# Patient Record
Sex: Male | Born: 1965 | Race: White | Hispanic: Yes | Marital: Married | Smoking: Former smoker
Health system: Southern US, Community
[De-identification: ages and names within clinical notes are randomized; demographics above are authoritative.]

## PROBLEM LIST (undated history)

## (undated) DIAGNOSIS — F329 Major depressive disorder, single episode, unspecified: Secondary | ICD-10-CM

## (undated) DIAGNOSIS — F32A Depression, unspecified: Secondary | ICD-10-CM

## (undated) DIAGNOSIS — Z8719 Personal history of other diseases of the digestive system: Secondary | ICD-10-CM

## (undated) DIAGNOSIS — G4730 Sleep apnea, unspecified: Secondary | ICD-10-CM

## (undated) DIAGNOSIS — F419 Anxiety disorder, unspecified: Secondary | ICD-10-CM

## (undated) DIAGNOSIS — J45909 Unspecified asthma, uncomplicated: Secondary | ICD-10-CM

## (undated) DIAGNOSIS — K219 Gastro-esophageal reflux disease without esophagitis: Secondary | ICD-10-CM

## (undated) HISTORY — PX: SHOULDER SURGERY: SHX246

## (undated) HISTORY — PX: KNEE ARTHROSCOPY W/ ACL RECONSTRUCTION: SHX1858

---

## 2008-01-10 HISTORY — PX: SHOULDER SURGERY: SHX246

## 2015-05-26 ENCOUNTER — Other Ambulatory Visit: Payer: Self-pay | Admitting: Specialist

## 2015-06-09 ENCOUNTER — Ambulatory Visit
Admission: RE | Admit: 2015-06-09 | Discharge: 2015-06-09 | Disposition: A | Discharge status: Home / Self Care | Payer: Managed Care, Other (non HMO) | Source: Ambulatory Visit | Attending: Specialist | Admitting: Specialist

## 2015-06-09 DIAGNOSIS — R935 Abnormal findings on diagnostic imaging of other abdominal regions, including retroperitoneum: Secondary | ICD-10-CM | POA: Diagnosis not present

## 2015-06-09 DIAGNOSIS — R932 Abnormal findings on diagnostic imaging of liver and biliary tract: Secondary | ICD-10-CM | POA: Diagnosis not present

## 2015-08-03 DIAGNOSIS — D638 Anemia in other chronic diseases classified elsewhere: Secondary | ICD-10-CM | POA: Insufficient documentation

## 2015-08-03 DIAGNOSIS — E782 Mixed hyperlipidemia: Secondary | ICD-10-CM | POA: Insufficient documentation

## 2015-08-03 DIAGNOSIS — K219 Gastro-esophageal reflux disease without esophagitis: Secondary | ICD-10-CM | POA: Insufficient documentation

## 2015-08-03 DIAGNOSIS — F418 Other specified anxiety disorders: Secondary | ICD-10-CM | POA: Insufficient documentation

## 2015-08-03 DIAGNOSIS — I10 Essential (primary) hypertension: Secondary | ICD-10-CM | POA: Insufficient documentation

## 2015-10-04 ENCOUNTER — Other Ambulatory Visit: Payer: Managed Care, Other (non HMO)

## 2015-10-06 ENCOUNTER — Encounter
Admission: RE | Admit: 2015-10-06 | Discharge: 2015-10-06 | Disposition: A | Discharge status: Home / Self Care | Payer: Managed Care, Other (non HMO) | Source: Ambulatory Visit | Attending: Specialist | Admitting: Specialist

## 2015-10-06 DIAGNOSIS — G4733 Obstructive sleep apnea (adult) (pediatric): Secondary | ICD-10-CM | POA: Insufficient documentation

## 2015-10-06 DIAGNOSIS — Z6839 Body mass index (BMI) 39.0-39.9, adult: Secondary | ICD-10-CM | POA: Diagnosis not present

## 2015-10-06 DIAGNOSIS — Z01812 Encounter for preprocedural laboratory examination: Secondary | ICD-10-CM | POA: Diagnosis present

## 2015-10-06 HISTORY — DX: Unspecified asthma, uncomplicated: J45.909

## 2015-10-06 HISTORY — DX: Depression, unspecified: F32.A

## 2015-10-06 HISTORY — DX: Gastro-esophageal reflux disease without esophagitis: K21.9

## 2015-10-06 HISTORY — DX: Anxiety disorder, unspecified: F41.9

## 2015-10-06 HISTORY — DX: Personal history of other diseases of the digestive system: Z87.19

## 2015-10-06 HISTORY — DX: Major depressive disorder, single episode, unspecified: F32.9

## 2015-10-06 HISTORY — DX: Sleep apnea, unspecified: G47.30

## 2015-10-06 LAB — BASIC METABOLIC PANEL
ANION GAP: 8 (ref 5–15)
BUN: 23 mg/dL — ABNORMAL HIGH (ref 6–20)
CALCIUM: 8.9 mg/dL (ref 8.9–10.3)
CO2: 27 mmol/L (ref 22–32)
CREATININE: 0.86 mg/dL (ref 0.61–1.24)
Chloride: 104 mmol/L (ref 101–111)
Glucose, Bld: 87 mg/dL (ref 65–99)
Potassium: 3.8 mmol/L (ref 3.5–5.1)
Sodium: 139 mmol/L (ref 135–145)

## 2015-10-06 LAB — DIFFERENTIAL
BASOS ABS: 0 10*3/uL (ref 0–0.1)
BASOS PCT: 0 %
EOS ABS: 0.1 10*3/uL (ref 0–0.7)
EOS PCT: 1 %
LYMPHS ABS: 1.6 10*3/uL (ref 1.0–3.6)
Lymphocytes Relative: 26 %
Monocytes Absolute: 0.5 10*3/uL (ref 0.2–1.0)
Monocytes Relative: 8 %
NEUTROS PCT: 65 %
Neutro Abs: 3.9 10*3/uL (ref 1.4–6.5)

## 2015-10-06 LAB — CBC
HCT: 38.1 % — ABNORMAL LOW (ref 40.0–52.0)
HEMOGLOBIN: 13.6 g/dL (ref 13.0–18.0)
MCH: 29.2 pg (ref 26.0–34.0)
MCHC: 35.6 g/dL (ref 32.0–36.0)
MCV: 81.8 fL (ref 80.0–100.0)
PLATELETS: 239 10*3/uL (ref 150–440)
RBC: 4.65 MIL/uL (ref 4.40–5.90)
RDW: 13.7 % (ref 11.5–14.5)
WBC: 6.1 10*3/uL (ref 3.8–10.6)

## 2015-10-06 LAB — TYPE AND SCREEN
ABO/RH(D): O POS
Antibody Screen: NEGATIVE

## 2015-10-06 NOTE — Patient Instructions (Signed)
  Your procedure is scheduled on: October 12, 2015 (Tuesday) Report to [facility] 2nd floor [facility] To find out your arrival time please call [phone] between 1PM - 3PM on October 11, 2015 (Monday)  Remember: Instructions that are not followed completely may result in serious medical risk, up to and including death, or upon the discretion of your surgeon and anesthesiologist your surgery may need to be rescheduled.    _x___ 1. Do not eat food or drink liquids after midnight. No gum chewing or hard candies.     _x___ 2. No Alcohol for 24 hours before or after surgery.   _x_  __3. No Smoking for 24 prior to surgery.   ____  4. Bring all medications with you on the day of surgery if instructed.    __x__ 5. Notify your doctor if there is any change in your medical condition     (cold, fever, infections).     Do not wear jewelry, make-up, hairpins, clips or nail polish.  Do not wear lotions, powders, or perfumes. You may wear deodorant.  Do not shave 48 hours prior to surgery. Men may shave face and neck.  Do not bring valuables to the hospital.    [facility] is not responsible for any belongings or valuables.               Contacts, dentures or bridgework may not be worn into surgery.  Leave your suitcase in the car. After surgery it may be brought to your room.  For patients admitted to the hospital, discharge time is determined by your treatment team.   Patients discharged the day of surgery will not be allowed to drive home.    Please read over the following fact sheets that you were given:   [facility] Preparing for Surgery and or MRSA Information   ___ Take these medicines the morning of surgery with A SIP OF WATER:    1.   2.  3.  4.  5.  6.  ____Fleets enema or Magnesium Citrate as directed.   _x___ Use CHG Soap or sage wipes as directed on instruction sheet   ____ Use inhalers on the day of surgery and bring to hospital day of surgery  ____ Stop  metformin 2 days prior to surgery    ____ Take 1/2 of usual insulin dose the night before surgery and none on the morning of surgery.          _x___ Stop aspirin or coumadin, or plavix (NO ASPIRIN)  x__ Stop Anti-inflammatories such as Advil, Aleve, Ibuprofen, Motrin, Naproxen,          Naprosyn, Goodies powders or aspirin products. Ok to take Tylenol.   ____ Stop supplements until after surgery.    _x___ Bring C-Pap to the hospital.

## 2015-10-12 ENCOUNTER — Inpatient Hospital Stay
Admission: RE | Admit: 2015-10-12 | Discharge: 2015-10-13 | DRG: 620 | Disposition: A | Discharge status: Home / Self Care | Payer: Managed Care, Other (non HMO) | Source: Ambulatory Visit | Attending: Specialist | Admitting: Specialist

## 2015-10-12 ENCOUNTER — Encounter: Payer: Self-pay | Admitting: *Deleted

## 2015-10-12 ENCOUNTER — Encounter
Admission: RE | Disposition: A | Discharge status: Home / Self Care | Payer: Self-pay | Source: Ambulatory Visit | Attending: Specialist

## 2015-10-12 ENCOUNTER — Inpatient Hospital Stay: Payer: Managed Care, Other (non HMO) | Admitting: Anesthesiology

## 2015-10-12 DIAGNOSIS — Z6841 Body Mass Index (BMI) 40.0 and over, adult: Secondary | ICD-10-CM | POA: Diagnosis not present

## 2015-10-12 DIAGNOSIS — F419 Anxiety disorder, unspecified: Secondary | ICD-10-CM | POA: Diagnosis present

## 2015-10-12 DIAGNOSIS — J45909 Unspecified asthma, uncomplicated: Secondary | ICD-10-CM | POA: Diagnosis present

## 2015-10-12 DIAGNOSIS — F329 Major depressive disorder, single episode, unspecified: Secondary | ICD-10-CM | POA: Diagnosis present

## 2015-10-12 DIAGNOSIS — R12 Heartburn: Secondary | ICD-10-CM | POA: Diagnosis present

## 2015-10-12 DIAGNOSIS — Z8261 Family history of arthritis: Secondary | ICD-10-CM

## 2015-10-12 DIAGNOSIS — Z87891 Personal history of nicotine dependence: Secondary | ICD-10-CM

## 2015-10-12 DIAGNOSIS — Z79899 Other long term (current) drug therapy: Secondary | ICD-10-CM | POA: Diagnosis not present

## 2015-10-12 DIAGNOSIS — K219 Gastro-esophageal reflux disease without esophagitis: Secondary | ICD-10-CM | POA: Diagnosis present

## 2015-10-12 DIAGNOSIS — K760 Fatty (change of) liver, not elsewhere classified: Secondary | ICD-10-CM | POA: Diagnosis present

## 2015-10-12 DIAGNOSIS — K449 Diaphragmatic hernia without obstruction or gangrene: Secondary | ICD-10-CM | POA: Diagnosis present

## 2015-10-12 DIAGNOSIS — Z8249 Family history of ischemic heart disease and other diseases of the circulatory system: Secondary | ICD-10-CM

## 2015-10-12 DIAGNOSIS — Z9989 Dependence on other enabling machines and devices: Secondary | ICD-10-CM

## 2015-10-12 DIAGNOSIS — G4733 Obstructive sleep apnea (adult) (pediatric): Secondary | ICD-10-CM | POA: Diagnosis present

## 2015-10-12 DIAGNOSIS — K8021 Calculus of gallbladder without cholecystitis with obstruction: Secondary | ICD-10-CM | POA: Diagnosis present

## 2015-10-12 HISTORY — PX: LAPAROSCOPIC GASTRIC SLEEVE RESECTION: SHX5895

## 2015-10-12 HISTORY — PX: CHOLECYSTECTOMY: SHX55

## 2015-10-12 HISTORY — PX: HIATAL HERNIA REPAIR: SHX195

## 2015-10-12 LAB — CBC
HEMATOCRIT: 39.7 % — AB (ref 40.0–52.0)
Hemoglobin: 13.6 g/dL (ref 13.0–18.0)
MCH: 28.4 pg (ref 26.0–34.0)
MCHC: 34.3 g/dL (ref 32.0–36.0)
MCV: 82.8 fL (ref 80.0–100.0)
PLATELETS: 243 10*3/uL (ref 150–440)
RBC: 4.79 MIL/uL (ref 4.40–5.90)
RDW: 13.8 % (ref 11.5–14.5)
WBC: 11.8 10*3/uL — ABNORMAL HIGH (ref 3.8–10.6)

## 2015-10-12 LAB — HEMOGLOBIN AND HEMATOCRIT, BLOOD
HCT: 39 % — ABNORMAL LOW (ref 40.0–52.0)
Hemoglobin: 13.5 g/dL (ref 13.0–18.0)

## 2015-10-12 LAB — CREATININE, SERUM
Creatinine, Ser: 1.15 mg/dL (ref 0.61–1.24)
GFR calc Af Amer: >60 mL/min (ref >60)
GFR calc non Af Amer: >60 mL/min (ref >60)

## 2015-10-12 LAB — ABO/RH: ABO/RH(D): O POS

## 2015-10-12 SURGERY — GASTRECTOMY, SLEEVE, LAPAROSCOPIC
Anesthesia: General | Wound class: Clean Contaminated

## 2015-10-12 MED ORDER — PHENYLEPHRINE HCL 10 MG/ML IJ SOLN
INTRAMUSCULAR | Status: DC | PRN
  Administered 2015-10-12 (×4): 100 ug via INTRAVENOUS

## 2015-10-12 MED ORDER — SUCCINYLCHOLINE CHLORIDE 20 MG/ML IJ SOLN
INTRAMUSCULAR | Status: DC | PRN
  Administered 2015-10-12: 120 mg via INTRAVENOUS

## 2015-10-12 MED ORDER — PROMETHAZINE HCL 25 MG/ML IJ SOLN: 12.5000 mg | Freq: Four times a day (QID) | INTRAMUSCULAR | Status: DC | PRN

## 2015-10-12 MED ORDER — ROCURONIUM BROMIDE 100 MG/10ML IV SOLN
INTRAVENOUS | Status: DC | PRN
  Administered 2015-10-12 (×2): 50 mg via INTRAVENOUS

## 2015-10-12 MED ORDER — CEFOXITIN SODIUM 2 G IV SOLR
2.0000 g | Freq: Three times a day (TID) | INTRAVENOUS | Status: AC
  Administered 2015-10-12 – 2015-10-13 (×2): 2 g via INTRAVENOUS
  Filled 2015-10-12 (×3): qty 2

## 2015-10-12 MED ORDER — LIDOCAINE-EPINEPHRINE (PF) 1 %-1:200000 IJ SOLN
INTRAMUSCULAR | Status: AC
  Filled 2015-10-12: qty 30

## 2015-10-12 MED ORDER — HYDROMORPHONE HCL 1 MG/ML IJ SOLN
INTRAMUSCULAR | Status: AC
  Filled 2015-10-12: qty 1

## 2015-10-12 MED ORDER — CEFOXITIN SODIUM-DEXTROSE 2-2.2 GM-% IV SOLR (PREMIX)
2.0000 g | Freq: Once | INTRAVENOUS | Status: AC
  Administered 2015-10-12: 2000 mg via INTRAVENOUS

## 2015-10-12 MED ORDER — ACETAMINOPHEN 160 MG/5ML PO SOLN
650.0000 mg | ORAL | Status: DC | PRN
  Filled 2015-10-12: qty 20.3

## 2015-10-12 MED ORDER — PREMIER PROTEIN SHAKE: 2.0000 [oz_av] | ORAL | Status: DC

## 2015-10-12 MED ORDER — FENTANYL CITRATE (PF) 100 MCG/2ML IJ SOLN
25.0000 ug | INTRAMUSCULAR | Status: AC | PRN
  Administered 2015-10-12 (×6): 25 ug via INTRAVENOUS

## 2015-10-12 MED ORDER — EPHEDRINE SULFATE 50 MG/ML IJ SOLN
INTRAMUSCULAR | Status: DC | PRN
  Administered 2015-10-12: 10 mg via INTRAVENOUS

## 2015-10-12 MED ORDER — SCOPOLAMINE 1 MG/3DAYS TD PT72
MEDICATED_PATCH | TRANSDERMAL | Status: AC
  Filled 2015-10-12: qty 1

## 2015-10-12 MED ORDER — LACTATED RINGERS IV SOLN
INTRAVENOUS | Status: DC
  Administered 2015-10-12: 12:00:00 via INTRAVENOUS

## 2015-10-12 MED ORDER — PROMETHAZINE HCL 25 MG/ML IJ SOLN: 6.2500 mg | INTRAMUSCULAR | Status: DC | PRN

## 2015-10-12 MED ORDER — MORPHINE SULFATE (PF) 2 MG/ML IV SOLN
2.0000 mg | INTRAVENOUS | Status: DC | PRN
  Administered 2015-10-12 – 2015-10-13 (×3): 2 mg via INTRAVENOUS
  Filled 2015-10-12 (×3): qty 1

## 2015-10-12 MED ORDER — CEFOXITIN SODIUM-DEXTROSE 2-2.2 GM-% IV SOLR (PREMIX)
INTRAVENOUS | Status: AC
  Filled 2015-10-12: qty 50

## 2015-10-12 MED ORDER — DEXAMETHASONE SODIUM PHOSPHATE 10 MG/ML IJ SOLN
INTRAMUSCULAR | Status: DC | PRN
  Administered 2015-10-12: 10 mg via INTRAVENOUS

## 2015-10-12 MED ORDER — ACETAMINOPHEN 10 MG/ML IV SOLN
1000.0000 mg | Freq: Once | INTRAVENOUS | Status: AC
  Administered 2015-10-12: 1000 mg via INTRAVENOUS

## 2015-10-12 MED ORDER — BUPIVACAINE-EPINEPHRINE (PF) 0.5% -1:200000 IJ SOLN
INTRAMUSCULAR | Status: AC
  Filled 2015-10-12: qty 30

## 2015-10-12 MED ORDER — PROPOFOL 10 MG/ML IV BOLUS
INTRAVENOUS | Status: DC | PRN
  Administered 2015-10-12: 200 mg via INTRAVENOUS
  Administered 2015-10-12: 50 mg via INTRAVENOUS

## 2015-10-12 MED ORDER — POTASSIUM CHLORIDE IN NACL 20-0.9 MEQ/L-% IV SOLN
INTRAVENOUS | Status: DC
  Administered 2015-10-12 – 2015-10-13 (×2): via INTRAVENOUS
  Filled 2015-10-12 (×6): qty 1000

## 2015-10-12 MED ORDER — ONDANSETRON HCL 4 MG/2ML IJ SOLN
INTRAMUSCULAR | Status: DC | PRN
  Administered 2015-10-12: 4 mg via INTRAVENOUS

## 2015-10-12 MED ORDER — ENOXAPARIN SODIUM 30 MG/0.3ML ~~LOC~~ SOLN
30.0000 mg | Freq: Two times a day (BID) | SUBCUTANEOUS | Status: DC
  Administered 2015-10-13: 30 mg via SUBCUTANEOUS
  Filled 2015-10-12: qty 0.3

## 2015-10-12 MED ORDER — ACETAMINOPHEN 160 MG/5ML PO SOLN
325.0000 mg | ORAL | Status: DC | PRN
  Filled 2015-10-12: qty 20.3

## 2015-10-12 MED ORDER — ONDANSETRON HCL 4 MG/2ML IJ SOLN
4.0000 mg | Freq: Four times a day (QID) | INTRAMUSCULAR | Status: DC
  Administered 2015-10-12 (×2): 4 mg via INTRAVENOUS
  Filled 2015-10-12 (×2): qty 2

## 2015-10-12 MED ORDER — ONDANSETRON HCL 4 MG/2ML IJ SOLN
4.0000 mg | INTRAMUSCULAR | Status: DC | PRN
  Filled 2015-10-12: qty 2

## 2015-10-12 MED ORDER — ACETAMINOPHEN 10 MG/ML IV SOLN
INTRAVENOUS | Status: AC
  Filled 2015-10-12: qty 100

## 2015-10-12 MED ORDER — FENTANYL CITRATE (PF) 100 MCG/2ML IJ SOLN
INTRAMUSCULAR | Status: AC
  Administered 2015-10-12: 25 ug
  Filled 2015-10-12: qty 2

## 2015-10-12 MED ORDER — FENTANYL CITRATE (PF) 100 MCG/2ML IJ SOLN
INTRAMUSCULAR | Status: DC | PRN
  Administered 2015-10-12 (×2): 50 ug via INTRAVENOUS
  Administered 2015-10-12: 100 ug via INTRAVENOUS

## 2015-10-12 MED ORDER — MIDAZOLAM HCL 2 MG/2ML IJ SOLN
INTRAMUSCULAR | Status: DC | PRN
  Administered 2015-10-12: 2 mg via INTRAVENOUS

## 2015-10-12 MED ORDER — OXYCODONE HCL 5 MG/5ML PO SOLN
5.0000 mg | ORAL | Status: DC | PRN
  Administered 2015-10-13: 10 mg via ORAL
  Administered 2015-10-13: 5 mg via ORAL
  Filled 2015-10-12: qty 10
  Filled 2015-10-12: qty 5

## 2015-10-12 MED ORDER — BUPIVACAINE-EPINEPHRINE (PF) 0.5% -1:200000 IJ SOLN
INTRAMUSCULAR | Status: DC | PRN
  Administered 2015-10-12: 17 mL

## 2015-10-12 MED ORDER — LIDOCAINE HCL (CARDIAC) 20 MG/ML IV SOLN
INTRAVENOUS | Status: DC | PRN
  Administered 2015-10-12: 100 mg via INTRAVENOUS

## 2015-10-12 MED ORDER — LIDOCAINE-EPINEPHRINE (PF) 1 %-1:200000 IJ SOLN
INTRAMUSCULAR | Status: DC | PRN
  Administered 2015-10-12: 8 mL

## 2015-10-12 MED ORDER — BUPIVACAINE-EPINEPHRINE (PF) 0.5% -1:200000 IJ SOLN
INTRAMUSCULAR | Status: AC
  Filled 2015-10-12: qty 10

## 2015-10-12 MED ORDER — FENTANYL CITRATE (PF) 100 MCG/2ML IJ SOLN
INTRAMUSCULAR | Status: AC
  Filled 2015-10-12: qty 2

## 2015-10-12 MED ORDER — GLYCOPYRROLATE 0.2 MG/ML IJ SOLN
INTRAMUSCULAR | Status: DC | PRN
  Administered 2015-10-12: 0.2 mg via INTRAVENOUS

## 2015-10-12 MED ORDER — SUGAMMADEX SODIUM 500 MG/5ML IV SOLN
INTRAVENOUS | Status: DC | PRN
  Administered 2015-10-12: 250 mg via INTRAVENOUS

## 2015-10-12 MED ORDER — HYDROMORPHONE HCL 1 MG/ML IJ SOLN
0.5000 mg | INTRAMUSCULAR | Status: AC | PRN
  Administered 2015-10-12 (×4): 0.5 mg via INTRAVENOUS

## 2015-10-12 MED ORDER — LACTATED RINGERS IV SOLN
INTRAVENOUS | Status: DC | PRN
  Administered 2015-10-12: 13:00:00 via INTRAVENOUS

## 2015-10-12 MED ORDER — SCOPOLAMINE 1 MG/3DAYS TD PT72
1.0000 | MEDICATED_PATCH | TRANSDERMAL | Status: DC
  Administered 2015-10-12: 1.5 mg via TRANSDERMAL

## 2015-10-12 SURGICAL SUPPLY — 74 items
ANCHOR TIS RET SYS 235ML (MISCELLANEOUS) ×3 IMPLANT
APPLIER CLIP ROT 10 11.4 M/L (STAPLE)
APPLIER CLIP ROT 13.4 12 LRG (CLIP) ×3
BANDAGE ELASTIC 6 LF NS (GAUZE/BANDAGES/DRESSINGS) ×6 IMPLANT
BLADE SURG SZ11 CARB STEEL (BLADE) ×3 IMPLANT
CANISTER SUCT 1200ML W/VALVE (MISCELLANEOUS) ×3 IMPLANT
CANNULA DILATOR 10 W/SLV (CANNULA) ×2 IMPLANT
CANNULA DILATOR 10MM W/SLV (CANNULA) ×1
CATH REDDICK CHOLANGI 4FR 50CM (CATHETERS) IMPLANT
CHLORAPREP W/TINT 26ML (MISCELLANEOUS) ×9 IMPLANT
CLIP APPLIE ROT 10 11.4 M/L (STAPLE) IMPLANT
CLIP APPLIE ROT 13.4 12 LRG (CLIP) ×1 IMPLANT
CLOSURE WOUND 1/2 X4 (GAUZE/BANDAGES/DRESSINGS)
DECANTER SPIKE VIAL GLASS SM (MISCELLANEOUS) ×3 IMPLANT
DEFOGGER SCOPE WARMER CLEARIFY (MISCELLANEOUS) ×3 IMPLANT
DRAPE SHEET LG 3/4 BI-LAMINATE (DRAPES) IMPLANT
DRAPE UTILITY 15X26 TOWEL STRL (DRAPES) ×6 IMPLANT
DRSG TEGADERM 4X4.75 (GAUZE/BANDAGES/DRESSINGS) IMPLANT
ELECT REM PT RETURN 9FT ADLT (ELECTROSURGICAL) ×3
ELECTRODE REM PT RTRN 9FT ADLT (ELECTROSURGICAL) ×1 IMPLANT
FILTER LAP SMOKE EVAC STRL (MISCELLANEOUS) ×3 IMPLANT
GAUZE SPONGE 4X4 12PLY STRL (GAUZE/BANDAGES/DRESSINGS) IMPLANT
GLOVE BIO SURGEON STRL SZ 6.5 (GLOVE) ×8 IMPLANT
GLOVE BIO SURGEON STRL SZ8 (GLOVE) ×3 IMPLANT
GLOVE BIO SURGEONS STRL SZ 6.5 (GLOVE) ×4
GOWN STRL REUS W/ TWL LRG LVL3 (GOWN DISPOSABLE) ×2 IMPLANT
GOWN STRL REUS W/ TWL XL LVL3 (GOWN DISPOSABLE) ×1 IMPLANT
GOWN STRL REUS W/TWL LRG LVL3 (GOWN DISPOSABLE) ×4
GOWN STRL REUS W/TWL XL LVL3 (GOWN DISPOSABLE) ×2
IRRIGATION STRYKERFLOW (MISCELLANEOUS) ×1 IMPLANT
IRRIGATOR STRYKERFLOW (MISCELLANEOUS) ×3
IV NS 1000ML (IV SOLUTION) ×2
IV NS 1000ML BAXH (IV SOLUTION) ×1 IMPLANT
KIT RM TURNOVER STRD PROC AR (KITS) ×3 IMPLANT
LABEL OR SOLS (LABEL) ×3 IMPLANT
LIQUID BAND (GAUZE/BANDAGES/DRESSINGS) ×3 IMPLANT
NDL INSUFF 14G 150MM VS150000 (NEEDLE) ×3 IMPLANT
NDL INSUFF ACCESS 14 VERSASTEP (NEEDLE) ×3 IMPLANT
NDL SAFETY 22GX1.5 (NEEDLE) ×3 IMPLANT
NEEDLE FILTER BLUNT 18X 1/2SAF (NEEDLE)
NEEDLE FILTER BLUNT 18X1 1/2 (NEEDLE) IMPLANT
NS IRRIG 500ML POUR BTL (IV SOLUTION) ×3 IMPLANT
PACK LAP CHOLECYSTECTOMY (MISCELLANEOUS) ×3 IMPLANT
RELOAD GREEN (STAPLE) ×3 IMPLANT
RELOAD STAPLER GOLD 60MM (STAPLE) ×5 IMPLANT
SCISSORS METZENBAUM CVD 33 (INSTRUMENTS) IMPLANT
SEAL FOR SCOPE WARMER C3101 (MISCELLANEOUS) ×3 IMPLANT
SHEARS HARMONIC ACE PLUS 45CM (MISCELLANEOUS) ×3 IMPLANT
SLEEVE ENDOPATH XCEL 5M (ENDOMECHANICALS) ×6 IMPLANT
SLEEVE GASTRECTOMY 36FR VISIGI (MISCELLANEOUS) ×3 IMPLANT
STAPLER ECHELON BIOABSB 60 FLE (MISCELLANEOUS) ×15 IMPLANT
STAPLER ECHELON LONG 60 440 (INSTRUMENTS) ×3 IMPLANT
STAPLER RELOAD GOLD 60MM (STAPLE) ×15
STRIP CLOSURE SKIN 1/2X4 (GAUZE/BANDAGES/DRESSINGS) IMPLANT
SUT CHROMIC 5 0 RB 1 27 (SUTURE) IMPLANT
SUT DEVICE BRAIDED 0X39 (SUTURE) ×6 IMPLANT
SUT MNCRL 4-0 (SUTURE) ×4
SUT MNCRL 4-0 27XMFL (SUTURE) ×2
SUT VIC AB 0 CT2 27 (SUTURE) ×3 IMPLANT
SUT VIC AB 3-0 SH 27 (SUTURE)
SUT VIC AB 3-0 SH 27X BRD (SUTURE) IMPLANT
SUT VIC AB 4-0 PS2 18 (SUTURE) ×6 IMPLANT
SUTURE MNCRL 4-0 27XMF (SUTURE) ×2 IMPLANT
SYR 20CC LL (SYRINGE) ×3 IMPLANT
SYR 3ML LL SCALE MARK (SYRINGE) ×3 IMPLANT
TROCAR BLADELESS 15MM (ENDOMECHANICALS) ×3 IMPLANT
TROCAR SL VERSASTEP 5M LG  B (MISCELLANEOUS) ×2
TROCAR SL VERSASTEP 5M LG B (MISCELLANEOUS) ×1 IMPLANT
TROCAR XCEL 12X100 BLDLESS (ENDOMECHANICALS) ×3 IMPLANT
TROCAR XCEL NON-BLD 11X100MML (ENDOMECHANICALS) ×3 IMPLANT
TROCAR XCEL NON-BLD 5MMX100MML (ENDOMECHANICALS) ×3 IMPLANT
TUBING INSUFFLATOR HEATED (MISCELLANEOUS) ×3 IMPLANT
TUBING INSUFFLATOR HI FLOW (MISCELLANEOUS) ×3 IMPLANT
WATER STERILE IRR 1000ML POUR (IV SOLUTION) ×3 IMPLANT

## 2015-10-12 NOTE — H&P (Signed)
See scanned h and p

## 2015-10-12 NOTE — Anesthesia Procedure Notes (Signed)
Procedure Name: Intubation Date/Time: 10/12/2015 1:19 PM Performed by: Michaele OfferSAVAGE, Katarzyna Wolven Pre-anesthesia Checklist: Patient identified, Emergency Drugs available, Suction available, Patient being monitored and Timeout performed Patient Re-evaluated:Patient Re-evaluated prior to inductionOxygen Delivery Method: Circle system utilized Preoxygenation: Pre-oxygenation with 100% oxygen Intubation Type: IV induction Ventilation: Oral airway inserted - appropriate to patient size Laryngoscope Size: Mac and 4 Grade View: Grade II Tube type: Oral Tube size: 7.5 mm Number of attempts: 1 Airway Equipment and Method: Rigid stylet Placement Confirmation: ETT inserted through vocal cords under direct vision,  positive ETCO2 and breath sounds checked- equal and bilateral Secured at: 21 cm Tube secured with: Tape Dental Injury: Teeth and Oropharynx as per pre-operative assessment

## 2015-10-12 NOTE — Brief Op Note (Signed)
10/12/2015  2:59 PM  PATIENT:  Victorio PalmAlfred House  50 y.o. male  PRE-OPERATIVE DIAGNOSIS:  morbid obesity  POST-OPERATIVE DIAGNOSIS:  morbid obesity  PROCEDURE:  Procedure(s): LAPAROSCOPIC GASTRIC SLEEVE RESECTION (N/A) LAPAROSCOPIC REPAIR OF HIATAL HERNIA (N/A) LAPAROSCOPIC CHOLECYSTECTOMY (N/A)  SURGEON:  Surgeon(s) and Role:    * Geoffry ParadiseJon M Kelyn Ponciano, MD - Primary  PHYSICIAN ASSISTANT:   ASSISTANTS: none   ANESTHESIA:   general  EBL:  Total I/O In: 900 [I.V.:900] Out: 25 [Blood:25]  BLOOD ADMINISTERED:none  DRAINS: none   LOCAL MEDICATIONS USED:  MARCAINE    and LIDOCAINE   SPECIMEN:  Source of Specimen:  portion of stomach; gall bladder  DISPOSITION OF SPECIMEN:  PATHOLOGY  COUNTS:  YES  TOURNIQUET:  * No tourniquets in log *  DICTATION: .Note written in EPIC  PLAN OF CARE: Admit to inpatient   PATIENT DISPOSITION:  PACU - hemodynamically stable.   Delay start of Pharmacological VTE agent (>24hrs) due to surgical blood loss or risk of bleeding: not applicable

## 2015-10-12 NOTE — Anesthesia Preprocedure Evaluation (Signed)
Anesthesia Evaluation  Patient identified by MRN, date of birth, ID band Patient awake    Reviewed: Allergy & Precautions, H&P , NPO status , Patient's Chart, lab work & pertinent test results  History of Anesthesia Complications Negative for: history of anesthetic complications  Airway Mallampati: III  TM Distance: <3 FB Neck ROM: full    Dental no notable dental hx. (+) Poor Dentition, Chipped   Pulmonary neg shortness of breath, asthma , sleep apnea and Continuous Positive Airway Pressure Ventilation , former smoker,    Pulmonary exam normal breath sounds clear to auscultation       Cardiovascular Exercise Tolerance: Good (-) angina(-) Past MI and (-) DOE negative cardio ROS Normal cardiovascular exam Rhythm:regular Rate:Normal     Neuro/Psych PSYCHIATRIC DISORDERS Anxiety Depression negative neurological ROS     GI/Hepatic Neg liver ROS, hiatal hernia, GERD  Medicated and Controlled,  Endo/Other  negative endocrine ROS  Renal/GU      Musculoskeletal   Abdominal   Peds  Hematology negative hematology ROS (+)   Anesthesia Other Findings Past Medical History: No date: Anxiety No date: Asthma     Comment: childhood asthma No date: Depression No date: GERD (gastroesophageal reflux disease) No date: History of hiatal hernia No date: Sleep apnea     Comment: C-PAP  Past Surgical History: No date: KNEE ARTHROSCOPY W/ ACL RECONSTRUCTION Right No date: SHOULDER SURGERY Right 2010: SHOULDER SURGERY Left     [facility] [place]  BMI    Body Mass Index:  39.13 kg/m      Reproductive/Obstetrics negative OB ROS                             Anesthesia Physical Anesthesia Plan  ASA: III  Anesthesia Plan: General ETT   Post-op Pain Management:    Induction:   Airway Management Planned:   Additional Equipment:   Intra-op Plan:   Post-operative Plan:    Informed Consent: I have reviewed the patients History and Physical, chart, labs and discussed the procedure including the risks, benefits and alternatives for the proposed anesthesia with the patient or authorized representative who has indicated his/her understanding and acceptance.     Plan Discussed with: Anesthesiologist, CRNA and Surgeon  Anesthesia Plan Comments:         Anesthesia Quick Evaluation

## 2015-10-12 NOTE — Plan of Care (Signed)
Problem: Pain Management: Goal: General experience of comfort will improve Outcome: Progressing PRN medication provided

## 2015-10-12 NOTE — Transfer of Care (Signed)
Immediate Anesthesia Transfer of Care Note  Patient: Kent Brooks  Procedure(s) Performed: Procedure(s): LAPAROSCOPIC GASTRIC SLEEVE RESECTION (N/A) LAPAROSCOPIC REPAIR OF HIATAL HERNIA (N/A) LAPAROSCOPIC CHOLECYSTECTOMY (N/A)  Patient Location: PACU  Anesthesia Type:General  Level of Consciousness: awake, alert , oriented and patient cooperative  Airway & Oxygen Therapy: Patient Spontanous Breathing and Patient connected to face mask oxygen  Post-op Assessment: Report given to RN, Post -op Vital signs reviewed and stable and Patient moving all extremities X 4  Post vital signs: Reviewed and stable  Last Vitals:  Vitals:   10/12/15 1131  BP: 133/68  Pulse: 63  Resp: 16  Temp: 36.8 C    Last Pain:  Vitals:   10/12/15 1131  TempSrc: Oral         Complications: No apparent anesthesia complications

## 2015-10-13 ENCOUNTER — Encounter: Payer: Self-pay | Admitting: Specialist

## 2015-10-13 LAB — CBC WITH DIFFERENTIAL/PLATELET
Basophils Absolute: 0 10*3/uL (ref 0–0.1)
Basophils Relative: 0 %
Eosinophils Absolute: 0 10*3/uL (ref 0–0.7)
Eosinophils Relative: 0 %
HEMATOCRIT: 37.4 % — AB (ref 40.0–52.0)
Hemoglobin: 12.9 g/dL — ABNORMAL LOW (ref 13.0–18.0)
LYMPHS PCT: 12 %
Lymphs Abs: 0.8 10*3/uL — ABNORMAL LOW (ref 1.0–3.6)
MCH: 28.7 pg (ref 26.0–34.0)
MCHC: 34.6 g/dL (ref 32.0–36.0)
MCV: 83 fL (ref 80.0–100.0)
MONO ABS: 0.5 10*3/uL (ref 0.2–1.0)
MONOS PCT: 8 %
NEUTROS ABS: 5.3 10*3/uL (ref 1.4–6.5)
Neutrophils Relative %: 80 %
Platelets: 240 10*3/uL (ref 150–440)
RBC: 4.5 MIL/uL (ref 4.40–5.90)
RDW: 14 % (ref 11.5–14.5)
WBC: 6.6 10*3/uL (ref 3.8–10.6)

## 2015-10-13 NOTE — Progress Notes (Signed)
Patient discharge teaching given, including activity, diet, follow-up appoints, and medications. Patient verbalized understanding of all discharge instructions. IV access was d/c'd. Vitals are stable. Skin is intact except as charted in most recent assessments. Pt to be escorted out by NT, to be driven home by family.  Kent Brooks E Hobbs  

## 2015-10-13 NOTE — Discharge Summary (Signed)
Physician Discharge Summary Note  Patient:  Kent Brooks is an 50 y.o., male MRN:  [id number] DOB:  Aug 30, 1965 Patient phone:  [phone] (home)  Patient address:   [address] [place] [address],  Total Time spent with patient: 15 minutes  Date of Admission:  10/12/2015 Date of Discharge: 10/13/15  Reason for Admission:  Morbid Obesity  Principal Problem: <principal problem not specified> Discharge Diagnoses: Patient Active Problem List   Diagnosis Date Noted  . Morbid obesity ([place]) [E66.01] 10/12/2015    Past Psychiatric History: n/a  Past Medical History:  Past Medical History:  Diagnosis Date  . Anxiety   . Asthma    childhood asthma  . Depression   . GERD (gastroesophageal reflux disease)   . History of hiatal hernia   . Sleep apnea    C-PAP    Past Surgical History:  Procedure Laterality Date  . CHOLECYSTECTOMY N/A 10/12/2015   Procedure: LAPAROSCOPIC CHOLECYSTECTOMY;  Surgeon: Bonner Puna, MD;  Location: [facility] ORS;  Service: General;  Laterality: N/A;  . HIATAL HERNIA REPAIR N/A 10/12/2015   Procedure: LAPAROSCOPIC REPAIR OF HIATAL HERNIA;  Surgeon: Bonner Puna, MD;  Location: [facility] ORS;  Service: General;  Laterality: N/A;  . KNEE ARTHROSCOPY W/ ACL RECONSTRUCTION Right   . LAPAROSCOPIC GASTRIC SLEEVE RESECTION N/A 10/12/2015   Procedure: LAPAROSCOPIC GASTRIC SLEEVE RESECTION;  Surgeon: Bonner Puna, MD;  Location: [facility] ORS;  Service: General;  Laterality: N/A;  . SHOULDER SURGERY Right   . SHOULDER SURGERY Left 2010   [facility], [place]   Family History: History reviewed. No pertinent family history. Family Psychiatric  History: n/a Social History:  History  Alcohol use Not on file    Comment: occassional     History  Drug Use No    Social History   Social History  . Marital status: Married    Spouse name: N/A  . Number of children: N/A  . Years of education: N/A   Social History Main Topics  . Smoking status: Former Smoker   Packs/day: 0.50    Types: Cigarettes    Quit date: 03/10/2002  . Smokeless tobacco: Never Used  . Alcohol use None     Comment: occassional  . Drug use: No  . Sexual activity: Not Asked   Other Topics Concern  . None   Social History Narrative  . None    Hospital Course:  Pt underwent laparoscopic sleeve gastrectomy and cholecystectomy and had an unremarkable post operative course. He met discharge criteria pod1 and was dc'd home.  Physical Findings: AIMS:  , ,  ,  ,    CIWA:    COWS:     Musculoskeletal: Strength & Muscle Tone: within normal limits Gait & Station: normal Patient leans: n/a  Psychiatric Specialty Exam: Physical Exam  ROS  Blood pressure 128/66, pulse 68, temperature 98.6 F (37 C), temperature source Oral, resp. rate 18, height 5' 7.5" (1.715 m), weight 123.2 kg (271 lb 9.7 oz), SpO2 95 %.Body mass index is 41.91 kg/m.  General Appearance: Well Groomed  Eye Contact:  Good  Speech:  Normal Rate  Volume:  Normal  Mood:  nl  Affect:  NA  Thought Process:  Coherent  Orientation:  Negative  Thought Content:  NA  Suicidal Thoughts:  No  Homicidal Thoughts:  No  Memory:  NA  Judgement:  NA  Insight:  NA  Psychomotor Activity:  NA  Concentration:  Concentration: NA  Recall:  NA  Fund of Knowledge:  NA  Language:  NA  Akathisia:  NA  Handed:  Right  AIMS (if indicated):     Assets:  Others:  n/a  ADL's:  Intact  Cognition:  WNL  Sleep:          Has this patient used any form of tobacco in the last 30 days? (Cigarettes, Smokeless Tobacco, Cigars, and/or Pipes) Yes, No  Blood Alcohol level:  No results found for: [facility]  Metabolic Disorder Labs:  No results found for: HGBA1C, MPG No results found for: PROLACTIN No results found for: CHOL, TRIG, HDL, CHOLHDL, VLDL, LDLCALC    Discharge destination:  Home   Discharge Instructions    Call MD for:  difficulty breathing, headache or visual disturbances    Complete by:  As directed    Call  MD for:  extreme fatigue    Complete by:  As directed    Call MD for:  persistant dizziness or light-headedness    Complete by:  As directed    Call MD for:  persistant nausea and vomiting    Complete by:  As directed    Call MD for:  redness, tenderness, or signs of infection (pain, swelling, redness, odor or green/yellow discharge around incision site)    Complete by:  As directed    Call MD for:  severe uncontrolled pain    Complete by:  As directed    Call MD for:  temperature >100.4    Complete by:  As directed    Discharge instructions    Complete by:  As directed    Remember to start your chewable or liquid B complex once you arrive home and take this daily for 30 days. You may continue to take this beyond 30 days if you choose. Stick with a Clear liquid diet for 2 days post operatively then you may advance to a Full Liquid diet for 12 days, which means you will be on a strict liquid diet for 14 days. Your daily goal is going to be to get in 60-80g of protein and 64oz of fluid.   Driving Restrictions    Complete by:  As directed    You may not drive until 24 hours past your last dose of pain medications.   Increase activity slowly    Complete by:  As directed    Lifting restrictions    Complete by:  As directed    Do not lift more than 10-15 pounds for 4-6 weeks post operatively   Other Restrictions    Complete by:  As directed    Avoid using your core muscles for 30 days after surgery - motions like pushing, pulling, climbing etc. Make sure to get up and walk frequently every day to help avoid developing blood clots.       Medication List    TAKE these medications     Indication  citalopram 20 MG tablet Commonly known as:  CELEXA Take 20 mg by mouth at bedtime.    levocetirizine 5 MG tablet Commonly known as:  XYZAL Take 5 mg by mouth every evening.    montelukast 10 MG tablet Commonly known as:  SINGULAIR Take 10 mg by mouth at bedtime.         Follow-up  recommendations:  2 weeks with Dr. Darnell Level  Comments:  Transcribed for Dr. Duke Salvia  Signed: Mardelle Matte, PA-C 10/13/2015, 12:53 PM

## 2015-10-13 NOTE — Progress Notes (Signed)
INTERVENTION:  RD consulted for nutrition education regarding inpatient bariatric surgery.   RD provided "The Liquid Diet" handout from the Bariatric Surgery Guide from the [facility]. This handout previously provided to patient prior to surgery is a duplicate copy. Discussed what foods/liquids are consistent with a Clear Liquid Diet and reinforced Key Concepts such as no carbonation, no caffeine, or sugar containing beverages. Provided methods to prevent dehydration and promote protein intake, using clock and sample fluid schedule. RD encouraged follow-up with outpatient dietitian after discharge.  Teach back method used.  Expect good compliance.  NUTRITION DIAGNOSIS:  Food and nutrition knowledge related deficit related to recent bariatric surgery as evidenced by dietitian consult for nutrition education   GOAL:  Patient will be able to sip and tolerate CL within 24-48 hours  MONITOR:  Energy intake Digestive system  ASSESSMENT:  Pt s/p gastric sleeve, hiatal hernia, cholecystectomy.  Past Medical History:  Diagnosis Date  . Anxiety   . Asthma    childhood asthma  . Depression   . GERD (gastroesophageal reflux disease)   . History of hiatal hernia   . Sleep apnea    C-PAP    Body mass index is 41.91 kg/m. Pt meets criteria for morbid obesity based on current BMI.   Current diet order is bariatric clear liquids, patient is consuming approximately 1 oz at this time.   Labs and medications reviewed.   LOW Care Level  Kent Brooks B. Freida BusmanAllen, RD, LDN [phone] (pager) Weekend/On-Call pager [phone])

## 2015-10-14 LAB — SURGICAL PATHOLOGY

## 2015-10-14 NOTE — Anesthesia Postprocedure Evaluation (Signed)
Anesthesia Post Note  Patient: Kent Brooks  Procedure(s) Performed: Procedure(s) (LRB): LAPAROSCOPIC GASTRIC SLEEVE RESECTION (N/A) LAPAROSCOPIC REPAIR OF HIATAL HERNIA (N/A) LAPAROSCOPIC CHOLECYSTECTOMY (N/A)  Patient location during evaluation: PACU Anesthesia Type: General Level of consciousness: awake and alert Pain management: pain level controlled Vital Signs Assessment: post-procedure vital signs reviewed and stable Respiratory status: spontaneous breathing, nonlabored ventilation, respiratory function stable and patient connected to nasal cannula oxygen Cardiovascular status: blood pressure returned to baseline and stable Postop Assessment: no signs of nausea or vomiting Anesthetic complications: no    Last Vitals:  Vitals:   10/13/15 0410 10/13/15 1201  BP: 126/64 128/66  Pulse: 67 68  Resp: 18 18  Temp: 36.9 C 37 C    Last Pain:  Vitals:   10/13/15 1201  TempSrc: Oral  PainSc:                  Lenard SimmerAndrew Tameah Mihalko

## 2015-11-04 NOTE — Op Note (Signed)
Preoperative diagnosis: Morbid obesity; hiatal hernia; gallstones Postoperative diagnosis: Same Procedure: Laparoscopic sleeve gastrectomy with hiatal hernia repair and cholecystectomy looked Surgeon: Smitty Cords Assistant: None Blood loss: None Complications: None Specimens: Portion of stomach and gallbladder Drains: None Anesthesia: Gen. endotracheal  Clinical history: The patient is 50 year old male request bariatric surgery.  Underwent an chronic extensive preoperative evaluation and was deemed an appropriate candidate.  All the options were discussed surgically speaking and he elected for the sleeve gastrectomy.  The risks benefits and therapeutic alternatives were discussed with him as well as side effects associated with sleep.  He is also noted to have gallstones preoperatively and recommended removal of that to prevent postoperative competitions associated with known gallstone disease and post bariatric acute weight loss patient's.  Furthermore he had a hiatal hernia and no reflux.  This is documented and authorization was obtained for repair preoperatively.  I recommended repaired so as not to aggravate the reflux which is known to be associated with sleeve gastrectomy.  Risks and benefits of those 2 procedures were discussed specifically and separately and again elected to proceed with all operations.  Details of procedure: The patient was taken to the operating room and placed on the operating table supine position.  Appropriate monitors and supplemental oxygen were given.  Antibiotics were given.  The patient's placed under general anesthesia.  Time out was performed.  The abdomen was prepped and draped in the usual sterile fashion.  The abdomen was accessed using a 5 mm optical trocar and pneumoperitoneum established.  Multiple other trochars were placed preparation for the procedure.  A liver retractor was placed.  The hiatus was explored posteriorly by taking down the pars flaccida using  Harmonic Scalpel and the right and left crura were mobilized to get 4 centers of intra-abdominal esophagus.  Vital hernia was moderate to large.  It required 3 interrupted 0 Surgidek sutures to repair.  Once that was achieved the greater curvature of the stomach was mobilized using Harmonic scalpel from the angle of Hiss to the pylorus.  The greater curve was then bisected at the antral level using a green load was seam guard Ethicon type 60 mm stapler.  This was with a 36 French bougie in place.  Multiple gold loads with seam guard were then fired through the contralateral left-sided 12 mm port in a hockey-stick type fashion and then running parallel to the lesser curvature to create the sleeve.  Next the stomach was removed through the 15 mm port.  No torsion of the sleeve was present.  No stricture of the sleeve was present.  No bleeding or leak was present from the staple line.  The bougie was subsequently removed and the liver retractor removed as well.  The gallbladder was raised anteriorly and cephalad and the plane of division.  Lateral traction of the infundibulum was performed and careful dissection was performed to create the view of safety showing only the cystic duct and cystic artery crossing crus hepatocystic triangle.  2 clips were placed on the cystic duct and it was divided sharply after was clipped distally as well.  The Harmonic scalpel was used to divide the cystic artery.  The gallbladder is moved up liver bed using Harmonic scalpel.  Specimen sent for pathologic evaluation.  Right upper quadrant was reinspected as well as sleeve and everything was hemostatic without any bile leakage.  The trochars removed without incident and the wounds closed using running 4-0 Vicryl and Dermabond.  The patient tolerated procedure well and  right recovery room stable condition.

## 2018-10-09 DIAGNOSIS — G4733 Obstructive sleep apnea (adult) (pediatric): Secondary | ICD-10-CM | POA: Insufficient documentation

## 2019-06-24 ENCOUNTER — Other Ambulatory Visit (INDEPENDENT_AMBULATORY_CARE_PROVIDER_SITE_OTHER): Payer: Self-pay | Admitting: Nurse Practitioner

## 2019-06-24 DIAGNOSIS — I83819 Varicose veins of unspecified lower extremities with pain: Secondary | ICD-10-CM

## 2019-06-25 ENCOUNTER — Ambulatory Visit (INDEPENDENT_AMBULATORY_CARE_PROVIDER_SITE_OTHER): Payer: Self-pay

## 2019-06-25 ENCOUNTER — Encounter (INDEPENDENT_AMBULATORY_CARE_PROVIDER_SITE_OTHER): Payer: Self-pay | Admitting: Nurse Practitioner

## 2019-06-25 ENCOUNTER — Ambulatory Visit (INDEPENDENT_AMBULATORY_CARE_PROVIDER_SITE_OTHER): Payer: Self-pay | Admitting: Nurse Practitioner

## 2019-06-25 ENCOUNTER — Other Ambulatory Visit: Payer: Self-pay

## 2019-06-25 VITALS — BP 130/88 | HR 69 | Resp 16 | Ht 67.0 in | Wt 218.0 lb

## 2019-06-25 DIAGNOSIS — I83819 Varicose veins of unspecified lower extremities with pain: Secondary | ICD-10-CM

## 2019-06-25 DIAGNOSIS — E782 Mixed hyperlipidemia: Secondary | ICD-10-CM

## 2019-06-25 DIAGNOSIS — I10 Essential (primary) hypertension: Secondary | ICD-10-CM

## 2019-06-27 ENCOUNTER — Encounter (INDEPENDENT_AMBULATORY_CARE_PROVIDER_SITE_OTHER): Payer: Self-pay | Admitting: Nurse Practitioner

## 2019-06-27 NOTE — Progress Notes (Signed)
Subjective:    Patient ID: Kent Brooks, male    DOB: 11/22/1965, 54 y.o.   MRN: [id number] Chief Complaint  Patient presents with  . New Patient (Initial Visit)    ref consult ble ven reflux    The patient is seen for evaluation of symptomatic varicose veins. The patient relates burning and stinging which worsened steadily throughout the course of the day, particularly with standing. The patient also notes an aching and throbbing pain over the varicosities, particularly with prolonged dependent positions. The symptoms are significantly improved with elevation.  The patient also notes that during hot weather the symptoms are greatly intensified. The patient states the pain from the varicose veins interferes with work, daily exercise, shopping and household maintenance. At this point, the symptoms are persistent and severe enough that they're having a negative impact on lifestyle and are interfering with daily activities.  The patient has been utilizing medical grade 1 compression stockings for the last several months and has noticed that it helps however despite use of compression the pain and discomfort still persist.  The patient has utilized over-the-counter analgesics which provide small relief.  There is no history of DVT, PE or superficial thrombophlebitis. There is no history of ulceration or hemorrhage. The patient does have a family history of varicose veins.  Today noninvasive studies show no evidence of DVT or superficial venous thrombosis in the right lower extremity.  No evidence of deep venous insufficiency in the right lower extremity.  The patient does have evidence of reflux in the great saphenous vein beginning at the mid thigh extending to the proximal calf.  The vein diameters range from 0.33cm to 0.63 cm.  Limited arterial duplex also revealed triphasic tibial artery waveforms in the anterior and posterior tibial arteries.   Review of Systems  Musculoskeletal:       Aching  and throbbing in lower extremities  All other systems reviewed and are negative.      Objective:   Physical Exam Vitals reviewed.  Cardiovascular:     Comments: Large varicosity on right lower extremity Pulmonary:     Effort: Pulmonary effort is normal.     Breath sounds: Normal breath sounds.  Musculoskeletal:        General: Tenderness present.  Neurological:     Mental Status: He is alert and oriented to person, place, and time.  Psychiatric:        Mood and Affect: Mood normal.        Behavior: Behavior normal.        Thought Content: Thought content normal.        Judgment: Judgment normal.     BP 130/88 (BP Location: Right Arm)   Pulse 69   Resp 16   Ht 5\' 7"  (1.702 m)   Wt 218 lb (98.9 kg)   BMI 34.14 kg/m   Past Medical History:  Diagnosis Date  . Anxiety   . Asthma    childhood asthma  . Depression   . GERD (gastroesophageal reflux disease)   . History of hiatal hernia   . Sleep apnea    C-PAP    Social History   Socioeconomic History  . Marital status: Married    Spouse name: Not on file  . Number of children: Not on file  . Years of education: Not on file  . Highest education level: Not on file  Occupational History  . Not on file  Tobacco Use  . Smoking status: Former Smoker  Packs/day: 0.50    Types: Cigarettes    Quit date: 03/10/2002    Years since quitting: 17.3  . Smokeless tobacco: Never Used  Substance and Sexual Activity  . Alcohol use: Not on file    Comment: occassional  . Drug use: No  . Sexual activity: Not on file  Other Topics Concern  . Not on file  Social History Narrative  . Not on file   Social Determinants of Health   Financial Resource Strain:   . Difficulty of Paying Living Expenses:   Food Insecurity:   . Worried About Programme researcher, broadcasting/film/video in the Last Year:   . Barista in the Last Year:   Transportation Needs:   . Freight forwarder (Medical):   Marland Kitchen Lack of Transportation (Non-Medical):    Physical Activity:   . Days of Exercise per Week:   . Minutes of Exercise per Session:   Stress:   . Feeling of Stress :   Social Connections:   . Frequency of Communication with Friends and Family:   . Frequency of Social Gatherings with Friends and Family:   . Attends Religious Services:   . Active Member of Clubs or Organizations:   . Attends Banker Meetings:   Marland Kitchen Marital Status:   Intimate Partner Violence:   . Fear of Current or Ex-Partner:   . Emotionally Abused:   Marland Kitchen Physically Abused:   . Sexually Abused:     Past Surgical History:  Procedure Laterality Date  . CHOLECYSTECTOMY N/A 10/12/2015   Procedure: LAPAROSCOPIC CHOLECYSTECTOMY;  Surgeon: Geoffry Paradise, MD;  Location: [facility] ORS;  Service: General;  Laterality: N/A;  . HIATAL HERNIA REPAIR N/A 10/12/2015   Procedure: LAPAROSCOPIC REPAIR OF HIATAL HERNIA;  Surgeon: Geoffry Paradise, MD;  Location: [facility] OR[facility];  Service: General;  Laterality: N/A;  . KNEE ARTHROSCOPY W/ ACL RECONSTRUCTION Right   . LAPAROSCOPIC GASTRIC SLEEVE RESECTION N/A 10/12/2015   Procedure: LAPAROSCOPIC GASTRIC SLEEVE RESECTION;  Surgeon: Geoffry Paradise, MD;  Location: [facility] OR[facility];  Service: General;  Laterality: N/A;  . SHOULDER SURGERY Right   . SHOULDER SURGERY Left 2010   [facility], [place]    History reviewed. No pertinent family history.  Allergies  Allergen Reactions  . Codeine     Other reaction(s): Unknown Does not help with pain  . Other     Patient stated he is not allergic to Codeine and Vicodin , however " they do not work".       Assessment & Plan:   1. Varicose veins with pain Recommend  I have reviewed my previous  discussion with the patient regarding  varicose veins and why they cause symptoms. Patient will continue  wearing graduated compression stockings class 1 on a daily basis, beginning first thing in the morning and removing them in the evening.    In addition, behavioral modification including  elevation during the day was again discussed and this will continue.  The patient has utilized over the counter pain medications and has been exercising.  However, at this time conservative therapy has not alleviated the patient's symptoms of leg pain and swelling  Recommend: laser ablation of the right great saphenous veins to eliminate the symptoms of pain and swelling of the lower extremities caused by the severe superficial venous reflux disease.   2. HTN, goal below 140/80 Currently good blood pressure control.  Patient currently not on medications for hypertension.  No changes made today.  Patient will continue to follow PCP.  3. Hyperlipidemia, mixed Most recent lab work shows lipids well controlled.  Currently not on medications for hyperlipidemia.  No changes made today we will continue to follow with PCP.   Current Outpatient Medications on File Prior to Visit  Medication Sig Dispense Refill  . citalopram (CELEXA) 20 MG tablet Take 20 mg by mouth at bedtime.    Marland Kitchen EPINEPHrine 0.3 mg/0.3 mL IJ SOAJ injection See admin instructions.    . fexofenadine (ALLEGRA) 180 MG tablet Take by mouth.    . montelukast (SINGULAIR) 10 MG tablet Take 10 mg by mouth at bedtime.    Marland Kitchen levocetirizine (XYZAL) 5 MG tablet Take 5 mg by mouth every evening. (Patient not taking: Reported on 06/25/2019)     No current facility-administered medications on file prior to visit.    There are no Patient Instructions on file for this visit. No follow-ups on file.   Kris Hartmann, NP

## 2019-07-28 ENCOUNTER — Encounter (INDEPENDENT_AMBULATORY_CARE_PROVIDER_SITE_OTHER): Payer: Self-pay | Admitting: Vascular Surgery

## 2019-07-28 ENCOUNTER — Other Ambulatory Visit: Payer: Self-pay

## 2019-07-28 ENCOUNTER — Ambulatory Visit (INDEPENDENT_AMBULATORY_CARE_PROVIDER_SITE_OTHER): Payer: 59 | Admitting: Vascular Surgery

## 2019-07-28 DIAGNOSIS — I83811 Varicose veins of right lower extremities with pain: Secondary | ICD-10-CM

## 2019-07-28 DIAGNOSIS — I83819 Varicose veins of unspecified lower extremities with pain: Secondary | ICD-10-CM | POA: Insufficient documentation

## 2019-07-28 NOTE — Progress Notes (Signed)
    MRN : [id number]  Kent Brooks is a 54 y.o. (December 17, 1965) male who presents with chief complaint of painful varicose veins.    The patient's right lower extremity was sterilely prepped and draped.  The ultrasound machine was used to visualize the right great saphenous vein throughout its course.  A segment below the knee was selected for access.  The saphenous vein was accessed without difficulty using ultrasound guidance with a micropuncture needle.   An 0.018  wire was placed beyond the saphenofemoral junction through the sheath and the microneedle was removed.  The 65 cm sheath was then placed over the wire and the wire and dilator were removed.  The laser fiber was placed through the sheath and its tip was placed approximately 2 cm below the saphenofemoral junction.  Tumescent anesthesia was then created with a dilute lidocaine solution.  Laser energy was then delivered with constant withdrawal of the sheath and laser fiber.  Approximately 1930 Joules of energy were delivered over a length of 45 cm.  Sterile dressings were placed.  The patient tolerated the procedure well without complications.

## 2019-07-30 ENCOUNTER — Other Ambulatory Visit: Payer: Self-pay

## 2019-07-30 ENCOUNTER — Other Ambulatory Visit (INDEPENDENT_AMBULATORY_CARE_PROVIDER_SITE_OTHER): Payer: Self-pay | Admitting: Vascular Surgery

## 2019-07-30 ENCOUNTER — Ambulatory Visit (INDEPENDENT_AMBULATORY_CARE_PROVIDER_SITE_OTHER): Payer: 59

## 2019-07-30 DIAGNOSIS — Z9889 Other specified postprocedural states: Secondary | ICD-10-CM

## 2020-07-01 ENCOUNTER — Encounter (HOSPITAL_COMMUNITY): Payer: Self-pay | Admitting: Plastic Surgery

## 2020-07-01 ENCOUNTER — Other Ambulatory Visit: Payer: Self-pay

## 2020-07-01 ENCOUNTER — Emergency Department (HOSPITAL_COMMUNITY): Payer: 59

## 2020-07-01 ENCOUNTER — Ambulatory Visit (HOSPITAL_COMMUNITY)
Admission: EM | Admit: 2020-07-01 | Discharge: 2020-07-01 | Disposition: A | Discharge status: Home / Self Care | Payer: 59 | Attending: Emergency Medicine | Admitting: Emergency Medicine

## 2020-07-01 ENCOUNTER — Encounter (HOSPITAL_COMMUNITY)
Admission: EM | Disposition: A | Discharge status: Home / Self Care | Payer: Self-pay | Source: Home / Self Care | Attending: Emergency Medicine

## 2020-07-01 ENCOUNTER — Emergency Department (HOSPITAL_COMMUNITY): Payer: 59 | Admitting: Anesthesiology

## 2020-07-01 DIAGNOSIS — Z87891 Personal history of nicotine dependence: Secondary | ICD-10-CM | POA: Diagnosis not present

## 2020-07-01 DIAGNOSIS — Z9049 Acquired absence of other specified parts of digestive tract: Secondary | ICD-10-CM | POA: Diagnosis not present

## 2020-07-01 DIAGNOSIS — S62620B Displaced fracture of medial phalanx of right index finger, initial encounter for open fracture: Secondary | ICD-10-CM | POA: Insufficient documentation

## 2020-07-01 DIAGNOSIS — Z20822 Contact with and (suspected) exposure to covid-19: Secondary | ICD-10-CM | POA: Insufficient documentation

## 2020-07-01 DIAGNOSIS — Z885 Allergy status to narcotic agent status: Secondary | ICD-10-CM | POA: Diagnosis not present

## 2020-07-01 DIAGNOSIS — W3182XA Contact with other commercial machinery, initial encounter: Secondary | ICD-10-CM | POA: Insufficient documentation

## 2020-07-01 DIAGNOSIS — Z23 Encounter for immunization: Secondary | ICD-10-CM | POA: Insufficient documentation

## 2020-07-01 DIAGNOSIS — S6991XA Unspecified injury of right wrist, hand and finger(s), initial encounter: Secondary | ICD-10-CM | POA: Diagnosis not present

## 2020-07-01 DIAGNOSIS — Z8719 Personal history of other diseases of the digestive system: Secondary | ICD-10-CM | POA: Insufficient documentation

## 2020-07-01 DIAGNOSIS — Z9884 Bariatric surgery status: Secondary | ICD-10-CM | POA: Diagnosis not present

## 2020-07-01 HISTORY — PX: AMPUTATION: SHX166

## 2020-07-01 HISTORY — PX: PERCUTANEOUS PINNING: SHX2209

## 2020-07-01 LAB — RESP PANEL BY RT-PCR (FLU A&B, COVID) ARPGX2
Influenza A by PCR: NEGATIVE
Influenza B by PCR: NEGATIVE
SARS Coronavirus 2 by RT PCR: NEGATIVE

## 2020-07-01 SURGERY — AMPUTATION DIGIT
Anesthesia: General | Site: Finger | Laterality: Right

## 2020-07-01 MED ORDER — CHLORHEXIDINE GLUCONATE 0.12 % MT SOLN
15.0000 mL | OROMUCOSAL | Status: DC
  Filled 2020-07-01: qty 15

## 2020-07-01 MED ORDER — TETANUS-DIPHTH-ACELL PERTUSSIS 5-2.5-18.5 LF-MCG/0.5 IM SUSY
0.5000 mL | PREFILLED_SYRINGE | Freq: Once | INTRAMUSCULAR | Status: AC
  Administered 2020-07-01: 0.5 mL via INTRAMUSCULAR
  Filled 2020-07-01: qty 0.5

## 2020-07-01 MED ORDER — CHLORHEXIDINE GLUCONATE 0.12 % MT SOLN
OROMUCOSAL | Status: AC
  Filled 2020-07-01: qty 15

## 2020-07-01 MED ORDER — CEFAZOLIN SODIUM-DEXTROSE 2-4 GM/100ML-% IV SOLN
2.0000 g | INTRAVENOUS | Status: AC
  Administered 2020-07-01: 2 g via INTRAVENOUS
  Filled 2020-07-01: qty 100

## 2020-07-01 MED ORDER — MIDAZOLAM HCL 2 MG/2ML IJ SOLN: 0.5000 mg | Freq: Once | INTRAMUSCULAR | Status: DC | PRN

## 2020-07-01 MED ORDER — MIDAZOLAM HCL 5 MG/5ML IJ SOLN
INTRAMUSCULAR | Status: DC | PRN
  Administered 2020-07-01: 2 mg via INTRAVENOUS

## 2020-07-01 MED ORDER — CEFAZOLIN SODIUM-DEXTROSE 1-4 GM/50ML-% IV SOLN
1.0000 g | Freq: Once | INTRAVENOUS | Status: AC
  Administered 2020-07-01: 1 g via INTRAVENOUS
  Filled 2020-07-01: qty 50

## 2020-07-01 MED ORDER — POVIDONE-IODINE 10 % EX SWAB
2.0000 | Freq: Once | CUTANEOUS | Status: AC
Start: 2020-07-01 — End: 2020-07-01
  Administered 2020-07-01: 2 via TOPICAL

## 2020-07-01 MED ORDER — PROPOFOL 10 MG/ML IV BOLUS
INTRAVENOUS | Status: AC
  Filled 2020-07-01: qty 40

## 2020-07-01 MED ORDER — MEPERIDINE HCL 25 MG/ML IJ SOLN
6.2500 mg | INTRAMUSCULAR | Status: DC | PRN
Start: 2020-07-01 — End: 2020-07-02

## 2020-07-01 MED ORDER — HYDROMORPHONE HCL 1 MG/ML IJ SOLN: 0.2500 mg | INTRAMUSCULAR | Status: DC | PRN

## 2020-07-01 MED ORDER — OXYCODONE HCL 5 MG/5ML PO SOLN
5.0000 mg | Freq: Once | ORAL | Status: DC | PRN
Start: 2020-07-01 — End: 2020-07-02

## 2020-07-01 MED ORDER — 0.9 % SODIUM CHLORIDE (POUR BTL) OPTIME
TOPICAL | Status: DC | PRN
  Administered 2020-07-01: 1000 mL

## 2020-07-01 MED ORDER — BUPIVACAINE HCL (PF) 0.25 % IJ SOLN
INTRAMUSCULAR | Status: DC | PRN
  Administered 2020-07-01: 10 mL

## 2020-07-01 MED ORDER — CEFAZOLIN SODIUM-DEXTROSE 2-4 GM/100ML-% IV SOLN: 2.0000 g | INTRAVENOUS | Status: DC

## 2020-07-01 MED ORDER — PROPOFOL 10 MG/ML IV BOLUS
INTRAVENOUS | Status: DC | PRN
  Administered 2020-07-01: 200 mg via INTRAVENOUS

## 2020-07-01 MED ORDER — CHLORHEXIDINE GLUCONATE 4 % EX LIQD
60.0000 mL | Freq: Once | CUTANEOUS | Status: DC
  Filled 2020-07-01: qty 60

## 2020-07-01 MED ORDER — FENTANYL CITRATE (PF) 100 MCG/2ML IJ SOLN
INTRAMUSCULAR | Status: DC | PRN
  Administered 2020-07-01 (×2): 50 ug via INTRAVENOUS

## 2020-07-01 MED ORDER — LACTATED RINGERS IV SOLN: INTRAVENOUS | Status: DC

## 2020-07-01 MED ORDER — DEXAMETHASONE SODIUM PHOSPHATE 10 MG/ML IJ SOLN
INTRAMUSCULAR | Status: DC | PRN
  Administered 2020-07-01: 4 mg via INTRAVENOUS

## 2020-07-01 MED ORDER — PROMETHAZINE HCL 25 MG/ML IJ SOLN: 6.2500 mg | INTRAMUSCULAR | Status: DC | PRN

## 2020-07-01 MED ORDER — ONDANSETRON HCL 4 MG/2ML IJ SOLN
INTRAMUSCULAR | Status: DC | PRN
  Administered 2020-07-01: 4 mg via INTRAVENOUS

## 2020-07-01 MED ORDER — MORPHINE SULFATE (PF) 4 MG/ML IV SOLN
4.0000 mg | INTRAVENOUS | Status: DC | PRN
  Administered 2020-07-01 (×2): 4 mg via INTRAVENOUS
  Filled 2020-07-01 (×2): qty 1

## 2020-07-01 MED ORDER — CEPHALEXIN 500 MG PO CAPS: 500.0000 mg | ORAL_CAPSULE | Freq: Four times a day (QID) | ORAL | 0 refills | Status: AC

## 2020-07-01 MED ORDER — OXYCODONE HCL 5 MG PO TABS: 5.0000 mg | ORAL_TABLET | Freq: Once | ORAL | Status: DC | PRN

## 2020-07-01 MED ORDER — HYDROCODONE-ACETAMINOPHEN 5-325 MG PO TABS: 1.0000 | ORAL_TABLET | ORAL | 0 refills | Status: DC | PRN

## 2020-07-01 MED ORDER — FENTANYL CITRATE (PF) 250 MCG/5ML IJ SOLN
INTRAMUSCULAR | Status: AC
  Filled 2020-07-01: qty 5

## 2020-07-01 MED ORDER — BUPIVACAINE HCL (PF) 0.25 % IJ SOLN
INTRAMUSCULAR | Status: AC
  Filled 2020-07-01: qty 30

## 2020-07-01 MED ORDER — LIDOCAINE HCL (CARDIAC) PF 100 MG/5ML IV SOSY
PREFILLED_SYRINGE | INTRAVENOUS | Status: DC | PRN
  Administered 2020-07-01: 20 mg via INTRAVENOUS

## 2020-07-01 MED ORDER — MORPHINE SULFATE (PF) 4 MG/ML IV SOLN
4.0000 mg | Freq: Once | INTRAVENOUS | Status: AC
  Administered 2020-07-01: 4 mg via INTRAVENOUS
  Filled 2020-07-01: qty 1

## 2020-07-01 MED ORDER — ONDANSETRON HCL 4 MG/2ML IJ SOLN
4.0000 mg | Freq: Once | INTRAMUSCULAR | Status: AC
  Administered 2020-07-01: 4 mg via INTRAVENOUS
  Filled 2020-07-01: qty 2

## 2020-07-01 MED ORDER — LACTATED RINGERS IV SOLN: INTRAVENOUS | Status: DC | PRN

## 2020-07-01 MED ORDER — MIDAZOLAM HCL 2 MG/2ML IJ SOLN
INTRAMUSCULAR | Status: AC
  Filled 2020-07-01: qty 2

## 2020-07-01 MED ORDER — EPHEDRINE SULFATE 50 MG/ML IJ SOLN
INTRAMUSCULAR | Status: DC | PRN
  Administered 2020-07-01: 5 mg via INTRAVENOUS

## 2020-07-01 SURGICAL SUPPLY — 65 items
BNDG COHESIVE 1X5 TAN STRL LF (GAUZE/BANDAGES/DRESSINGS) IMPLANT
BNDG CONFORM 2 STRL LF (GAUZE/BANDAGES/DRESSINGS) ×2 IMPLANT
BNDG ELASTIC 2X5.8 VLCR STR LF (GAUZE/BANDAGES/DRESSINGS) ×2 IMPLANT
BNDG ELASTIC 3X5.8 VLCR STR LF (GAUZE/BANDAGES/DRESSINGS) ×2 IMPLANT
BNDG ELASTIC 4X5.8 VLCR STR LF (GAUZE/BANDAGES/DRESSINGS) ×2 IMPLANT
BNDG GAUZE ELAST 4 BULKY (GAUZE/BANDAGES/DRESSINGS) ×4 IMPLANT
CAP PIN ORTHO PINK (CAP) ×2 IMPLANT
CAP PIN PROTECTOR ORTHO WHT (CAP) ×2 IMPLANT
CORD BIPOLAR FORCEPS 12FT (ELECTRODE) ×2 IMPLANT
COVER SURGICAL LIGHT HANDLE (MISCELLANEOUS) ×2 IMPLANT
COVER WAND RF STERILE (DRAPES) ×2 IMPLANT
CUFF TOURN SGL QUICK 18X4 (TOURNIQUET CUFF) ×2 IMPLANT
CUFF TOURN SGL QUICK 24 (TOURNIQUET CUFF) ×2
CUFF TRNQT CYL 24X4X16.5-23 (TOURNIQUET CUFF) ×1 IMPLANT
DRAPE INCISE IOBAN 66X45 STRL (DRAPES) IMPLANT
DRAPE SURG 17X23 STRL (DRAPES) ×2 IMPLANT
DRSG ADAPTIC 3X8 NADH LF (GAUZE/BANDAGES/DRESSINGS) ×2 IMPLANT
DRSG XEROFORM 1X8 (GAUZE/BANDAGES/DRESSINGS) ×2 IMPLANT
GAUZE SPONGE 2X2 8PLY STRL LF (GAUZE/BANDAGES/DRESSINGS) IMPLANT
GAUZE SPONGE 4X4 12PLY STRL (GAUZE/BANDAGES/DRESSINGS) ×2 IMPLANT
GAUZE XEROFORM 1X8 LF (GAUZE/BANDAGES/DRESSINGS) ×2 IMPLANT
GLOVE SURG ENC MOIS LTX SZ8 (GLOVE) ×2 IMPLANT
GLOVE SURG ENC TEXT LTX SZ7.5 (GLOVE) ×4 IMPLANT
GLOVE SURG ORTHO LTX SZ8 (GLOVE) ×2 IMPLANT
GLOVE SURG UNDER POLY LF SZ8.5 (GLOVE) ×2 IMPLANT
GOWN STRL REUS W/ TWL LRG LVL3 (GOWN DISPOSABLE) ×2 IMPLANT
GOWN STRL REUS W/ TWL XL LVL3 (GOWN DISPOSABLE) ×1 IMPLANT
GOWN STRL REUS W/TWL LRG LVL3 (GOWN DISPOSABLE) ×4
GOWN STRL REUS W/TWL XL LVL3 (GOWN DISPOSABLE) ×2
K-WIRE .035 (WIRE) ×4
KIT BASIN OR (CUSTOM PROCEDURE TRAY) ×2 IMPLANT
KIT TURNOVER KIT B (KITS) ×2 IMPLANT
KWIRE .035 (WIRE) ×2 IMPLANT
MANIFOLD NEPTUNE II (INSTRUMENTS) ×2 IMPLANT
NEEDLE HYPO 25GX1X1/2 BEV (NEEDLE) ×2 IMPLANT
NS IRRIG 1000ML POUR BTL (IV SOLUTION) ×2 IMPLANT
PACK ORTHO EXTREMITY (CUSTOM PROCEDURE TRAY) ×2 IMPLANT
PAD ABD 8X10 STRL (GAUZE/BANDAGES/DRESSINGS) IMPLANT
PAD ARMBOARD 7.5X6 YLW CONV (MISCELLANEOUS) ×4 IMPLANT
PAD CAST 4YDX4 CTTN HI CHSV (CAST SUPPLIES) ×1 IMPLANT
PADDING CAST COTTON 4X4 STRL (CAST SUPPLIES) ×2
PADDING CAST SYNTHETIC 4 (CAST SUPPLIES) ×1
PADDING CAST SYNTHETIC 4X4 STR (CAST SUPPLIES) ×1 IMPLANT
SET CYSTO W/LG BORE CLAMP LF (SET/KITS/TRAYS/PACK) IMPLANT
SLING ARM FOAM STRAP XLG (SOFTGOODS) ×2 IMPLANT
SOL PREP POV-IOD 4OZ 10% (MISCELLANEOUS) ×4 IMPLANT
SOL PREP PROV IODINE SCRUB 4OZ (MISCELLANEOUS) ×2 IMPLANT
SPECIMEN JAR SMALL (MISCELLANEOUS) ×2 IMPLANT
SPLINT FIBERGLASS 3X12 (CAST SUPPLIES) ×2 IMPLANT
SPONGE GAUZE 2X2 STER 10/PKG (GAUZE/BANDAGES/DRESSINGS)
SPONGE LAP 4X18 RFD (DISPOSABLE) ×2 IMPLANT
STAPLER VISISTAT 35W (STAPLE) IMPLANT
SUCTION FRAZIER HANDLE 10FR (MISCELLANEOUS)
SUCTION TUBE FRAZIER 10FR DISP (MISCELLANEOUS) IMPLANT
SUT CHROMIC 4 0 PS 2 18 (SUTURE) ×6 IMPLANT
SUT MERSILENE 4 0 P 3 (SUTURE) IMPLANT
SUT PROLENE 4 0 PS 2 18 (SUTURE) IMPLANT
SUT VIC AB 4-0 PS2 18 (SUTURE) ×2 IMPLANT
SWAB CULTURE ESWAB REG 1ML (MISCELLANEOUS) IMPLANT
SYR CONTROL 10ML LL (SYRINGE) IMPLANT
TOWEL GREEN STERILE (TOWEL DISPOSABLE) ×2 IMPLANT
TOWEL GREEN STERILE FF (TOWEL DISPOSABLE) ×2 IMPLANT
TUBE CONNECTING 12X1/4 (SUCTIONS) ×2 IMPLANT
WATER STERILE IRR 1000ML POUR (IV SOLUTION) ×2 IMPLANT
YANKAUER SUCT BULB TIP NO VENT (SUCTIONS) ×2 IMPLANT

## 2020-07-01 NOTE — Progress Notes (Signed)
Orthopedic Tech Progress Note Patient Details:  Kent Brooks 13-Jul-1965 [id number]  Ortho Devices Type of Ortho Device: Finger splint Ortho Device/Splint Location: index Ortho Device/Splint Interventions: Ordered, Application, Adjustment   Post Interventions Patient Tolerated: Well  Mallika Sanmiguel A Rawson Minix 07/01/2020, 12:05 PM

## 2020-07-01 NOTE — ED Notes (Signed)
Last po intake was around 5am

## 2020-07-01 NOTE — ED Notes (Signed)
Pt transported to xray 

## 2020-07-01 NOTE — Transfer of Care (Signed)
Immediate Anesthesia Transfer of Care Note  Patient: Kent Brooks  Procedure(s) Performed: REVISION AMPUTATION OF INDEX FINGER (Right: Finger) POSSIBLE PERCUTANEOUS PINNING EXTREMITY (Right: Finger)  Patient Location: PACU  Anesthesia Type:General  Level of Consciousness: awake, alert , oriented and patient cooperative  Airway & Oxygen Therapy: Patient Spontanous Breathing and Patient connected to nasal cannula oxygen  Post-op Assessment: Report given to RN and Post -op Vital signs reviewed and stable  Post vital signs: Reviewed and stable  Last Vitals:  Vitals Value Taken Time  BP    Temp    Pulse 91 07/01/20 2043  Resp 15 07/01/20 2043  SpO2 98 % 07/01/20 2043  Vitals shown include unvalidated device data.  Last Pain:  Vitals:   07/01/20 1745  TempSrc: Oral  PainSc: 7       Patients Stated Pain Goal: 4 (07/01/20 1745)  Complications: No notable events documented.

## 2020-07-01 NOTE — Anesthesia Postprocedure Evaluation (Signed)
Anesthesia Post Note  Patient: Kent Brooks  Procedure(s) Performed: REVISION AMPUTATION OF INDEX FINGER (Right: Finger) POSSIBLE PERCUTANEOUS PINNING EXTREMITY (Right: Finger)     Patient location during evaluation: PACU Anesthesia Type: General Level of consciousness: awake and alert, patient cooperative and oriented Pain management: pain level controlled Vital Signs Assessment: post-procedure vital signs reviewed and stable Respiratory status: spontaneous breathing, nonlabored ventilation and respiratory function stable Cardiovascular status: blood pressure returned to baseline and stable Postop Assessment: no apparent nausea or vomiting and adequate PO intake Anesthetic complications: no   No notable events documented.  Last Vitals:  Vitals:   07/01/20 2045 07/01/20 2100  BP: (!) 147/88 (!) 147/80  Pulse: 87 78  Resp: 16 10  Temp:  (!) 36.2 C  SpO2: 96% 94%    Last Pain:  Vitals:   07/01/20 2100  TempSrc:   PainSc: 0-No pain                 Jerzey Komperda,E. Terrilee Dudzik

## 2020-07-01 NOTE — ED Triage Notes (Signed)
Pt brought to ED by [facility] via stretcher with c/o crush injury to right index finger after incident with equipment at place of employment.

## 2020-07-01 NOTE — Anesthesia Procedure Notes (Signed)
Procedure Name: LMA Insertion Date/Time: 07/01/2020 7:10 PM Performed by: Tillman Abide, CRNA Pre-anesthesia Checklist: Patient identified, Emergency Drugs available, Suction available and Patient being monitored Patient Re-evaluated:Patient Re-evaluated prior to induction Oxygen Delivery Method: Circle System Utilized Preoxygenation: Pre-oxygenation with 100% oxygen Induction Type: IV induction Ventilation: Mask ventilation without difficulty LMA: LMA inserted LMA Size: 5.0 Number of attempts: 1 Placement Confirmation: positive ETCO2 Tube secured with: Tape Dental Injury: Teeth and Oropharynx as per pre-operative assessment

## 2020-07-01 NOTE — Consult Note (Signed)
Reason for Consult:Right index finger partial ampuation Referring Physician: Linwood Dibbles Time called: 0827 Time at bedside: 0944   Kent Brooks is an 55 y.o. male.  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 He had immediate pain and a significant laceration and came to the ED. X-rays showed a significant fx and hand surgery was consulted. He is RHD.  Past Medical History:  Diagnosis Date   Anxiety    Asthma    childhood asthma   Depression    GERD (gastroesophageal reflux disease)    History of hiatal hernia    Sleep apnea    C-PAP    Past Surgical History:  Procedure Laterality Date   CHOLECYSTECTOMY N/A 10/12/2015   Procedure: LAPAROSCOPIC CHOLECYSTECTOMY;  Surgeon: Geoffry Paradise, MD;  Location: [facility] ORS;  Service: General;  Laterality: N/A;   HIATAL HERNIA REPAIR N/A 10/12/2015   Procedure: LAPAROSCOPIC REPAIR OF HIATAL HERNIA;  Surgeon: Geoffry Paradise, MD;  Location: [facility] OR[facility];  Service: General;  Laterality: N/A;   KNEE ARTHROSCOPY W/ ACL RECONSTRUCTION Right    LAPAROSCOPIC GASTRIC SLEEVE RESECTION N/A 10/12/2015   Procedure: LAPAROSCOPIC GASTRIC SLEEVE RESECTION;  Surgeon: Geoffry Paradise, MD;  Location: [facility] ORS;  Service: General;  Laterality: N/A;   SHOULDER SURGERY Right    SHOULDER SURGERY Left 2010   [facility], [place]    No family history on file.  Social History:  reports that he quit smoking about 18 years ago. His smoking use included cigarettes. He smoked an average of 0.50 packs per day. He has never used smokeless tobacco. He reports that he does not use drugs. No history on file for alcohol use.  Allergies:  Allergies  Allergen Reactions   Codeine Other (See Comments)    Other reaction(s): Unknown Does not help with pain   Other Other (See Comments)    Patient stated he is not allergic to Codeine and Vicodin , however " they do not work".    Medications: I have  reviewed the patient's current medications.  Results for orders placed or performed during the hospital encounter of 07/01/20 (from the past 48 hour(s))  Resp Panel by RT-PCR (Flu A&B, Covid) Nasopharyngeal Swab     Status: None   Collection Time: 07/01/20  6:56 AM   Specimen: Nasopharyngeal Swab; Nasopharyngeal(NP) swabs in vial transport medium  Result Value Ref Range   SARS Coronavirus 2 by RT PCR NEGATIVE NEGATIVE    Comment: (NOTE) SARS-CoV-2 target nucleic acids are NOT DETECTED.  The SARS-CoV-2 RNA is generally detectable in upper respiratory specimens during the acute phase of infection. The lowest concentration of SARS-CoV-2 viral copies this assay can detect is 138 copies/mL. A negative result does not preclude SARS-Cov-2 infection and should not be used as the sole basis for treatment or other patient management decisions. A negative result may occur with  improper specimen collection/handling, submission of specimen other than nasopharyngeal swab, presence of viral mutation(s) within the areas targeted by this assay, and inadequate number of viral copies(<138 copies/mL). A negative result must be combined with clinical observations, patient history, and epidemiological information. The expected result is Negative.  Fact Sheet for Patients:  BloggerCourse.com  Fact Sheet for Healthcare Providers:  SeriousBroker.it  This test is no t yet approved or cleared by the [place] FDA and  has been authorized for detection and/or diagnosis of SARS-CoV-2 by  FDA under an Emergency Use Authorization (EUA). This EUA will remain  in effect (meaning this test can be used) for the duration of the COVID-19 declaration under Section 564(b)(1) of the Act, 21 U.S.C.section 360bbb-3(b)(1), unless the authorization is terminated  or revoked sooner.       Influenza A by PCR NEGATIVE NEGATIVE   Influenza B by PCR NEGATIVE NEGATIVE     Comment: (NOTE) The Xpert Xpress SARS-CoV-2/FLU/RSV plus assay is intended as an aid in the diagnosis of influenza from Nasopharyngeal swab specimens and should not be used as a sole basis for treatment. Nasal washings and aspirates are unacceptable for Xpert Xpress SARS-CoV-2/FLU/RSV testing.  Fact Sheet for Patients: BloggerCourse.com  Fact Sheet for Healthcare Providers: SeriousBroker.it  This test is not yet approved or cleared by the [place] FDA and has been authorized for detection and/or diagnosis of SARS-CoV-2 by FDA under an Emergency Use Authorization (EUA). This EUA will remain in effect (meaning this test can be used) for the duration of the COVID-19 declaration under Section 564(b)(1) of the Act, 21 U.S.C. section 360bbb-3(b)(1), unless the authorization is terminated or revoked.  Performed at [facility], [address], [place], [place] [address]     DG Finger Index Right  Result Date: 07/01/2020 CLINICAL DATA:  Crush injury. EXAM: RIGHT INDEX FINGER 2+V COMPARISON:  No prior. FINDINGS: Severe comminuted fracture of the distal aspect of the middle phalanx of the right second digit. Severe overriding of fracture fragments. Severe so associated soft tissue wound. No radiopaque foreign body. IMPRESSION: Severe comminuted fracture of the distal aspect of the middle phalanx of the right second digit. Severe overriding of fracture fragments. Severe associated soft tissue wound. Electronically Signed   By: Maisie Fus  Register   On: 07/01/2020 07:54    Review of Systems  HENT:  Negative for ear discharge, ear pain, hearing loss and tinnitus.   Eyes:  Negative for photophobia and pain.  Respiratory:  Negative for cough and shortness of breath.   Cardiovascular:  Negative for chest pain.  Gastrointestinal:  Negative for abdominal pain, nausea and vomiting.  Genitourinary:  Negative for dysuria, flank pain, frequency  and urgency.  Musculoskeletal:  Positive for arthralgias (Right index finger). Negative for back pain, myalgias and neck pain.  Neurological:  Negative for dizziness and headaches.  Hematological:  Does not bruise/bleed easily.  Psychiatric/Behavioral:  The patient is not nervous/anxious.   Blood pressure 131/87, pulse 65, temperature (!) 97.5 F (36.4 C), temperature source Oral, resp. rate 16, height 5\' 8"  (1.727 m), weight 98.9 kg, SpO2 93 %. Physical Exam Constitutional:      General: He is not in acute distress.    Appearance: He is well-developed. He is not diaphoretic.  HENT:     Head: Normocephalic and atraumatic.  Eyes:     General: No scleral icterus.       Right eye: No discharge.        Left eye: No discharge.     Conjunctiva/sclera: Conjunctivae normal.  Cardiovascular:     Rate and Rhythm: Normal rate and regular rhythm.  Pulmonary:     Effort: Pulmonary effort is normal. No respiratory distress.  Musculoskeletal:     Cervical back: Normal range of motion.     Comments: Right shoulder, elbow, wrist, digits- Partial amputation through DIP joint index finger, sensation intact distally radial/ulnar, cap refill <2s, grossly unstable, no blocks to motion  Sens  Ax/R/M/U intact  Mot   Ax/ R/ PIN/  M/ AIN/ U intact  Rad 2+  Skin:    General: Skin is warm and dry.  Neurological:     Mental Status: He is alert.  Psychiatric:        Mood and Affect: Mood normal.        Behavior: Behavior normal.       Assessment/Plan: Right index finger partial amputation -- For revision amputation, possible pinning by Dr. Arita Miss this afternoon. Please keep NPO.    Freeman Caldron, PA-C Orthopedic Surgery [phone] 07/01/2020, 9:57 AM

## 2020-07-01 NOTE — Interval H&P Note (Signed)
Patient seen and examined. Risk and benefits discussed. Proceed with surgery.

## 2020-07-01 NOTE — Brief Op Note (Signed)
07/01/2020  8:36 PM  PATIENT:  Kent Brooks  55 y.o. male  PRE-OPERATIVE DIAGNOSIS:  OPEN DISPLACED MIDDLE FINGER  POST-OPERATIVE DIAGNOSIS:  OPEN DISPLACED MIDDLE FINGER  PROCEDURE:  Procedure(s): REVISION AMPUTATION OF INDEX FINGER (Right) POSSIBLE PERCUTANEOUS PINNING EXTREMITY (Right)  SURGEON:  Surgeon(s) and Role:    * Conor Lata, Wendy Poet, MD - Primary  PHYSICIAN ASSISTANT: None  ASSISTANTS: none   ANESTHESIA:   general  EBL:  10 mL   BLOOD ADMINISTERED:none  DRAINS: none   LOCAL MEDICATIONS USED:  MARCAINE     SPECIMEN:  No Specimen  DISPOSITION OF SPECIMEN:  PATHOLOGY  COUNTS:  YES  TOURNIQUET:   Total Tourniquet Time Documented: Upper Arm (Right) - 51 minutes Total: Upper Arm (Right) - 51 minutes   DICTATION: .Reubin Milan Dictation  PLAN OF CARE: Discharge to home after PACU  PATIENT DISPOSITION:  PACU - hemodynamically stable.   Delay start of Pharmacological VTE agent (>24hrs) due to surgical blood loss or risk of bleeding: not applicable

## 2020-07-01 NOTE — Anesthesia Preprocedure Evaluation (Addendum)
Anesthesia Evaluation  Patient identified by MRN, date of birth, ID band Patient awake    Reviewed: Allergy & Precautions, NPO status , Patient's Chart, lab work & pertinent test results  History of Anesthesia Complications Negative for: history of anesthetic complications  Airway Mallampati: I  TM Distance: >3 FB Neck ROM: Full    Dental  (+) Teeth Intact, Dental Advisory Given   Pulmonary sleep apnea and Continuous Positive Airway Pressure Ventilation , COPD,  COPD inhaler, former smoker,  07/01/2020 SARS coronavirus NEG   breath sounds clear to auscultation       Cardiovascular negative cardio ROS   Rhythm:Regular Rate:Normal     Neuro/Psych Anxiety Depression negative neurological ROS     GI/Hepatic Neg liver ROS, hiatal hernia, GERD  Controlled,S/p gastric sleeve   Endo/Other  obese  Renal/GU negative Renal ROS     Musculoskeletal   Abdominal (+) + obese,   Peds  Hematology negative hematology ROS (+)   Anesthesia Other Findings   Reproductive/Obstetrics                            Anesthesia Physical Anesthesia Plan  ASA: 3  Anesthesia Plan: General   Post-op Pain Management:    Induction: Intravenous  PONV Risk Score and Plan: 2 and Ondansetron, Dexamethasone and Treatment may vary due to age or medical condition  Airway Management Planned: LMA  Additional Equipment: None  Intra-op Plan:   Post-operative Plan:   Informed Consent: I have reviewed the patients History and Physical, chart, labs and discussed the procedure including the risks, benefits and alternatives for the proposed anesthesia with the patient or authorized representative who has indicated his/her understanding and acceptance.     Dental advisory given  Plan Discussed with: CRNA and Surgeon  Anesthesia Plan Comments:        Anesthesia Quick Evaluation

## 2020-07-01 NOTE — H&P (View-Only) (Signed)
Reason for Consult:Right index finger partial ampuation Referring Physician: Linwood Dibbles Time called: 0827 Time at bedside: 0944   Kent Brooks is an 55 y.o. male.  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 He had immediate pain and a significant laceration and came to the ED. X-rays showed a significant fx and hand surgery was consulted. He is RHD.  Past Medical History:  Diagnosis Date   Anxiety    Asthma    childhood asthma   Depression    GERD (gastroesophageal reflux disease)    History of hiatal hernia    Sleep apnea    C-PAP    Past Surgical History:  Procedure Laterality Date   CHOLECYSTECTOMY N/A 10/12/2015   Procedure: LAPAROSCOPIC CHOLECYSTECTOMY;  Surgeon: Geoffry Paradise, MD;  Location: [facility] ORS;  Service: General;  Laterality: N/A;   HIATAL HERNIA REPAIR N/A 10/12/2015   Procedure: LAPAROSCOPIC REPAIR OF HIATAL HERNIA;  Surgeon: Geoffry Paradise, MD;  Location: [facility] ORS;  Service: General;  Laterality: N/A;   KNEE ARTHROSCOPY W/ ACL RECONSTRUCTION Right    LAPAROSCOPIC GASTRIC SLEEVE RESECTION N/A 10/12/2015   Procedure: LAPAROSCOPIC GASTRIC SLEEVE RESECTION;  Surgeon: Geoffry Paradise, MD;  Location: [facility] ORS;  Service: General;  Laterality: N/A;   SHOULDER SURGERY Right    SHOULDER SURGERY Left 2010   [facility], [place]    No family history on file.  Social History:  reports that he quit smoking about 18 years ago. His smoking use included cigarettes. He smoked an average of 0.50 packs per day. He has never used smokeless tobacco. He reports that he does not use drugs. No history on file for alcohol use.  Allergies:  Allergies  Allergen Reactions   Codeine Other (See Comments)    Other reaction(s): Unknown Does not help with pain   Other Other (See Comments)    Patient stated he is not allergic to Codeine and Vicodin , however " they do not work".    Medications: I have  reviewed the patient's current medications.  Results for orders placed or performed during the hospital encounter of 07/01/20 (from the past 48 hour(s))  Resp Panel by RT-PCR (Flu A&B, Covid) Nasopharyngeal Swab     Status: None   Collection Time: 07/01/20  6:56 AM   Specimen: Nasopharyngeal Swab; Nasopharyngeal(NP) swabs in vial transport medium  Result Value Ref Range   SARS Coronavirus 2 by RT PCR NEGATIVE NEGATIVE    Comment: (NOTE) SARS-CoV-2 target nucleic acids are NOT DETECTED.  The SARS-CoV-2 RNA is generally detectable in upper respiratory specimens during the acute phase of infection. The lowest concentration of SARS-CoV-2 viral copies this assay can detect is 138 copies/mL. A negative result does not preclude SARS-Cov-2 infection and should not be used as the sole basis for treatment or other patient management decisions. A negative result may occur with  improper specimen collection/handling, submission of specimen other than nasopharyngeal swab, presence of viral mutation(s) within the areas targeted by this assay, and inadequate number of viral copies(<138 copies/mL). A negative result must be combined with clinical observations, patient history, and epidemiological information. The expected result is Negative.  Fact Sheet for Patients:  BloggerCourse.com  Fact Sheet for Healthcare Providers:  SeriousBroker.it  This test is no t yet approved or cleared by the [place] FDA and  has been authorized for detection and/or diagnosis of SARS-CoV-2 by  FDA under an Emergency Use Authorization (EUA). This EUA will remain  in effect (meaning this test can be used) for the duration of the COVID-19 declaration under Section 564(b)(1) of the Act, 21 U.S.C.section 360bbb-3(b)(1), unless the authorization is terminated  or revoked sooner.       Influenza A by PCR NEGATIVE NEGATIVE   Influenza B by PCR NEGATIVE NEGATIVE     Comment: (NOTE) The Xpert Xpress SARS-CoV-2/FLU/RSV plus assay is intended as an aid in the diagnosis of influenza from Nasopharyngeal swab specimens and should not be used as a sole basis for treatment. Nasal washings and aspirates are unacceptable for Xpert Xpress SARS-CoV-2/FLU/RSV testing.  Fact Sheet for Patients: BloggerCourse.com  Fact Sheet for Healthcare Providers: SeriousBroker.it  This test is not yet approved or cleared by the [place] FDA and has been authorized for detection and/or diagnosis of SARS-CoV-2 by FDA under an Emergency Use Authorization (EUA). This EUA will remain in effect (meaning this test can be used) for the duration of the COVID-19 declaration under Section 564(b)(1) of the Act, 21 U.S.C. section 360bbb-3(b)(1), unless the authorization is terminated or revoked.  Performed at [facility], [address], [place], [place] [address]     DG Finger Index Right  Result Date: 07/01/2020 CLINICAL DATA:  Crush injury. EXAM: RIGHT INDEX FINGER 2+V COMPARISON:  No prior. FINDINGS: Severe comminuted fracture of the distal aspect of the middle phalanx of the right second digit. Severe overriding of fracture fragments. Severe so associated soft tissue wound. No radiopaque foreign body. IMPRESSION: Severe comminuted fracture of the distal aspect of the middle phalanx of the right second digit. Severe overriding of fracture fragments. Severe associated soft tissue wound. Electronically Signed   By: Maisie Fus  Register   On: 07/01/2020 07:54    Review of Systems  HENT:  Negative for ear discharge, ear pain, hearing loss and tinnitus.   Eyes:  Negative for photophobia and pain.  Respiratory:  Negative for cough and shortness of breath.   Cardiovascular:  Negative for chest pain.  Gastrointestinal:  Negative for abdominal pain, nausea and vomiting.  Genitourinary:  Negative for dysuria, flank pain, frequency  and urgency.  Musculoskeletal:  Positive for arthralgias (Right index finger). Negative for back pain, myalgias and neck pain.  Neurological:  Negative for dizziness and headaches.  Hematological:  Does not bruise/bleed easily.  Psychiatric/Behavioral:  The patient is not nervous/anxious.   Blood pressure 131/87, pulse 65, temperature (!) 97.5 F (36.4 C), temperature source Oral, resp. rate 16, height 5\' 8"  (1.727 m), weight 98.9 kg, SpO2 93 %. Physical Exam Constitutional:      General: He is not in acute distress.    Appearance: He is well-developed. He is not diaphoretic.  HENT:     Head: Normocephalic and atraumatic.  Eyes:     General: No scleral icterus.       Right eye: No discharge.        Left eye: No discharge.     Conjunctiva/sclera: Conjunctivae normal.  Cardiovascular:     Rate and Rhythm: Normal rate and regular rhythm.  Pulmonary:     Effort: Pulmonary effort is normal. No respiratory distress.  Musculoskeletal:     Cervical back: Normal range of motion.     Comments: Right shoulder, elbow, wrist, digits- Partial amputation through DIP joint index finger, sensation intact distally radial/ulnar, cap refill <2s, grossly unstable, no blocks to motion  Sens  Ax/R/M/U intact  Mot   Ax/ R/ PIN/  M/ AIN/ U intact  Rad 2+  Skin:    General: Skin is warm and dry.  Neurological:     Mental Status: He is alert.  Psychiatric:        Mood and Affect: Mood normal.        Behavior: Behavior normal.       Assessment/Plan: Right index finger partial amputation -- For revision amputation, possible pinning by Dr. Arita Miss this afternoon. Please keep NPO.    Freeman Caldron, PA-C Orthopedic Surgery [phone] 07/01/2020, 9:57 AM

## 2020-07-01 NOTE — ED Provider Notes (Signed)
[facility] EMERGENCY DEPARTMENT Provider Note   CSN: [id number] Arrival date & time: 07/01/20  3976     History Chief Complaint  Patient presents with   Finger Injury    Kent Brooks is a 55 y.o. male.  HPI  Patient presents to the ED for evaluation of a finger injury.  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  Patient ended up sustaining a near amputation of the index finger of his right hand.  Patient states he sustained a laceration and injury just distal to the IP joint.  Patient states he can still feel the tip of his finger.  He denies any other injuries.  Tetanus is not up-to-date  Past Medical History:  Diagnosis Date   Anxiety    Asthma    childhood asthma   Depression    GERD (gastroesophageal reflux disease)    History of hiatal hernia    Sleep apnea    C-PAP    Patient Active Problem List   Diagnosis Date Noted   Varicose veins with pain 07/28/2019   OSA (obstructive sleep apnea) 10/09/2018   Morbid obesity (HCC) 10/12/2015   Anemia of chronic disease 08/03/2015   Depression with anxiety 08/03/2015   GERD without esophagitis 08/03/2015   HTN, goal below 140/80 08/03/2015   Hyperlipidemia, mixed 08/03/2015    Past Surgical History:  Procedure Laterality Date   CHOLECYSTECTOMY N/A 10/12/2015   Procedure: LAPAROSCOPIC CHOLECYSTECTOMY;  Surgeon: Geoffry Paradise, MD;  Location: [facility] ORS;  Service: General;  Laterality: N/A;   HIATAL HERNIA REPAIR N/A 10/12/2015   Procedure: LAPAROSCOPIC REPAIR OF HIATAL HERNIA;  Surgeon: Geoffry Paradise, MD;  Location: [facility] ORS;  Service: General;  Laterality: N/A;   KNEE ARTHROSCOPY W/ ACL RECONSTRUCTION Right    LAPAROSCOPIC GASTRIC SLEEVE RESECTION N/A 10/12/2015   Procedure: LAPAROSCOPIC GASTRIC SLEEVE RESECTION;  Surgeon: Geoffry Paradise, MD;  Location: [facility] ORS;  Service: General;  Laterality: N/A;    SHOULDER SURGERY Right    SHOULDER SURGERY Left 2010   [facility], [place]       No family history on file.  Social History   Tobacco Use   Smoking status: Former    Packs/day: 0.50    Pack years: 0.00    Types: Cigarettes    Quit date: 03/10/2002    Years since quitting: 18.3   Smokeless tobacco: Never  Substance Use Topics   Drug use: No    Home Medications Prior to Admission medications   Medication Sig Start Date End Date Taking? Authorizing Provider  acetaminophen (TYLENOL) 500 MG tablet Take 1,000 mg by mouth every 6 (six) hours as needed for moderate pain or headache.   Yes [provider]  EPINEPHrine 0.3 mg/0.3 mL IJ SOAJ injection Inject 0.3 mg into the muscle as needed for anaphylaxis. 01/20/19  Yes [provider]  Fexofenadine HCl (ALLERGY 24-HR PO) Take 1 tablet by mouth at bedtime.   Yes [provider]  ibuprofen (ADVIL) 200 MG tablet Take 400 mg by mouth every 6 (six) hours as needed for moderate pain or headache.   Yes [provider]  montelukast (SINGULAIR) 10 MG tablet Take 10 mg by mouth at bedtime.   Yes [provider]  Multiple Vitamin (ONE-A-DAY MENS PO) Take 1 tablet by mouth daily.   Yes [provider]  PARoxetine (PAXIL) 20 MG tablet Take 20 mg by mouth at bedtime. 06/05/20  Yes [provider]    Allergies    Codeine and Other  Review of Systems   Review of Systems  All other systems reviewed and are negative.  Physical Exam Updated Vital Signs BP 128/82   Pulse 69   Temp (!) 97.5 F (36.4 C) (Oral)   Resp 15   Ht 1.727 m (5\' 8" )   Wt 98.9 kg   SpO2 93%   BMI 33.15 kg/m   Physical Exam Vitals and nursing note reviewed.  Constitutional:      General: He is not in acute distress.    Appearance: He is well-developed.  HENT:     Head: Normocephalic and atraumatic.     Right Ear: External ear normal.     Left Ear: External ear normal.  Eyes:      General: No scleral icterus.       Right eye: No discharge.        Left eye: No discharge.     Conjunctiva/sclera: Conjunctivae normal.  Neck:     Trachea: No tracheal deviation.  Cardiovascular:     Rate and Rhythm: Normal rate.  Pulmonary:     Effort: Pulmonary effort is normal. No respiratory distress.     Breath sounds: No stridor.  Abdominal:     General: There is no distension.  Musculoskeletal:        General: Tenderness and deformity present. No swelling.     Right hand: Deformity, laceration and tenderness present. Decreased range of motion. Normal sensation. Normal capillary refill.     Cervical back: Neck supple.     Comments: Partial amputation of the index finger distal to the IP joint  Skin:    General: Skin is warm and dry.     Findings: No rash.  Neurological:     Mental Status: He is alert.     Cranial Nerves: Cranial nerve deficit: no gross deficits.    ED Results / Procedures / Treatments   Labs (all labs ordered are listed, but only abnormal results are displayed) Labs Reviewed  RESP PANEL BY RT-PCR (FLU A&B, COVID) [id number]    EKG None  Radiology DG Finger Index Right  Result Date: 07/01/2020 CLINICAL DATA:  Crush injury. EXAM: RIGHT INDEX FINGER 2+V COMPARISON:  No prior. FINDINGS: Severe comminuted fracture of the distal aspect of the middle phalanx of the right second digit. Severe overriding of fracture fragments. Severe so associated soft tissue wound. No radiopaque foreign body. IMPRESSION: Severe comminuted fracture of the distal aspect of the middle phalanx of the right second digit. Severe overriding of fracture fragments. Severe associated soft tissue wound. Electronically Signed   By: 07/03/2020  Register   On: 07/01/2020 07:54    Procedures Procedures   Medications Ordered in ED Medications  morphine 4 MG/ML injection 4 mg (4 mg Intravenous Given 07/01/20 1055)  ceFAZolin (ANCEF) IVPB 1 g/50 mL premix (1 g Intravenous New Bag/Given 07/01/20  1043)  chlorhexidine (HIBICLENS) 4 % liquid 4 application (has no administration in time range)  povidone-iodine 10 % swab 2 application (has no administration in time range)  ceFAZolin (ANCEF) IVPB 2g/100 mL premix (has no administration in time range)  morphine 4 MG/ML injection 4 mg (4 mg Intravenous Given 07/01/20 0723)  ondansetron (ZOFRAN) injection 4 mg (4 mg Intravenous Given 07/01/20 0723)  Tdap (BOOSTRIX) injection 0.5 mL (0.5 mLs Intramuscular Given 07/01/20 0827)  ceFAZolin (ANCEF) IVPB  1 g/50 mL premix (0 g Intravenous Stopped 07/01/20 1002)  morphine 4 MG/ML injection 4 mg (4 mg Intravenous Given 07/01/20 0920)    ED Course  I have reviewed the triage vital signs and the nursing notes.  Pertinent labs & imaging results that were available during my care of the patient were reviewed by me and considered in my medical decision making (see chart for details).  Clinical Course as of 07/01/20 1108  Thu Jul 01, 2020  8938 X-ray findings reviewed.  Fracture noted with significant displacement [JK]  0814 We will consult with orthopedic hand surgery [JK]  0828 D/w ortho PA Jeffery/  Will see pt in the ED [JK]  [phone] Plan is for patient to go to the OR this afternoon [JK]  0838 COVID and flu are negative [JK]    Clinical Course User Index [JK] Linwood Dibbles, MD   MDM Rules/Calculators/A&P                          Pt presents with open fx of index finger.  Xray shows displaced comminuted fx.  Pt still has sensation and cap refill distal aspect.  Plan is for OR today for repair. Final Clinical Impression(s) / ED Diagnoses Final diagnoses:  Open displaced fracture of middle phalanx of right index finger, initial encounter     Linwood Dibbles, MD 07/01/20 1108

## 2020-07-01 NOTE — Op Note (Signed)
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  Open reduction percutaneous fixation right index finger middle phalanx fracture 2.  Primary repair of right index finger extensor tendon 3.  Complex closure right index finger totaling 4 cm in length  SURGEON: Ancil Linsey, MD  ASSISTANT: None  ANESTHESIA:  General.   COMPLICATIONS: None.   INDICATIONS FOR PROCEDURE:  The patient, Kent Brooks is a 55 y.o. male born on 12-Jul-1965, is here for treatment of partial amputation right index finger MRN: [id number]  CONSENT:  Informed consent was obtained directly from the patient. Risks, benefits and alternatives were fully discussed. Specific risks including but not limited to bleeding, infection, hematoma, seroma, scarring, pain, contracture, asymmetry, wound healing problems, and need for further surgery were all discussed. The patient did have an ample opportunity to have questions answered to satisfaction.   DESCRIPTION OF PROCEDURE:  The patient was taken to the operating room. SCDs were placed and antibiotics were given.  General anesthesia was administered.  The patient's operative site was prepped and draped in a sterile fashion. A time out was performed and all information was confirmed to be correct.  Started by infiltrating a plain Marcaine digital block around the base of the digit.  The arm was exsanguinated with gravity and tourniquet inflated to 250 mmHg.  I then copiously irrigated the wound and began to debride devitalized tissue with pickups and scissors.  This was mostly jagged skin edges that appeared like they would be devitalized.  At this point I explored the wound more carefully.  There was a clear comminuted heavily displaced fracture of the distal aspect of the middle phalanx.  This did involve  the DIP joint.  The flexor tendon did appear to be intact as did the digital nerves.  The entire extensor mechanism was disrupted and a portion of it had been ripped out proximally remaining attached to the distal phalanx.  I then placed 2 wet K wires from proximal to distal and brought them out the fingertip.  These were 0.035 K wires.  I then held the closed reduction and the K wires were then Passed back across the fracture segment and were seated in the proximal cortex of the middle phalanx but did not enter the PIP joint.  This held a mostly satisfactory reduction.  There was a small bony fragment distally that was not encompassed by this but I was able to impacted manually and it stayed in position.  This fragment seemed too small for K wire.  At this point the portions of the extensor mechanism that could be identified were repaired with several interrupted buried figure-of-eight 4-0 PDS sutures.  Then a dermal tenodesis was performed as well with mattress 4-0 PDS sutures dorsally.  The remainder of the wound was closed with interrupted 4-0 chromic sutures as it extended up along the volar aspect.  The K wires were then bent and cut and pin caps were applied.  Tourniquet was then let down with tourniquet time being 51 minutes.  Initially the index finger was slow to pink up but ultimately it did achieving normal capillary refill compared to the adjacent digits.  A volar splint was applied.  The patient tolerated the procedure well.  There were no complications. The patient was allowed to wake from anesthesia, extubated and taken to the recovery room in satisfactory  condition.

## 2020-07-01 NOTE — Discharge Instructions (Signed)
No heavy activities. Elevate arm to reduce swelling.  Diet: Regular  Wound Care: Keep dressing clean & dry.  Do not get splint wet.  You can shower but make sure to place a bag over the splint with a good seal to keep it dry.  Do not remove dressing.  Call doctor if any unusual problems occur such as pain, excessive bleeding, unrelieved nausea/vomiting, fever &/or chills.  Follow-up appointment: Should be seen by hand therapist for splint change within two weeks followed by appointment with Dr. Thaddaeus Granja. 

## 2020-07-01 NOTE — ED Notes (Signed)
Obtained consent for procedure. Called ortho tech to place static splint on pt's finger.

## 2020-07-02 ENCOUNTER — Encounter (HOSPITAL_COMMUNITY): Payer: Self-pay | Admitting: Plastic Surgery

## 2020-07-05 ENCOUNTER — Telehealth: Payer: Self-pay

## 2020-07-05 NOTE — Telephone Encounter (Signed)
Called [facility] at 5:00pm, spoke with Dorene Grebe. Verified patient picked up Hydrocodone-Acetaminophen Thursday, 07/01/2020. Called patient back. States he has called our office multiple times last Friday after his surgery. VM was checked, no messages were left from patient Friday, nor today. Patient was very upset that he was prescribed  Hydrocodone-Acetaminophen when it states on his "Allergy" list, "he is not allergic to medication, does not work for him". During the conversation he was very upset and agitated when asking him questions. I advised him that I was trying to only help him. I inquired what pain medication has worked for him in the past, patient answered "he did not know, but not Hydrocodone, and needed something stronger". Patient has tried alternating Tylenol, IBU with the Narco. This still does not help, and can only take so much IBU due to stomach issues. Patient also requested something for sleep and anxiety. At this time, he does not have a prescription for these issues, only depression. Explained to him these are not medications that are normally prescribed from our office. Suggested to call [facility] for something for sleep and anxiety.Until he is able to call his Drs. Offfice, he can try Benadryl that will help issues. Advised patient I will send a message via text to Dr. Arita Miss as he has been in surgery all day and will let him know the situation. He was very upset that he could not get medications sent in at this time. Until he hears from Dr. Arita Miss or, receives a notice from pharmacy to continue alternating tylenol, IBU and Narco.

## 2020-07-05 NOTE — Telephone Encounter (Signed)
Patient called to say that he is in a lot of pain because the pain medication prescribed doesn't work for him.  Patient said that this information is specified in his chart.  Patient said he would like to know why we did not look at his chart and who prescribed the medication.  Patient said that he is planning to go to the ED if he does not hear from [place] today because of the pain.  Please call.  **Patient's preferred pharmacy is on [address] in [place].

## 2020-07-06 NOTE — Telephone Encounter (Signed)
Patient has a follow up appointment with Dr. Arita Miss on 07/14/20 1:45

## 2020-07-14 ENCOUNTER — Ambulatory Visit (INDEPENDENT_AMBULATORY_CARE_PROVIDER_SITE_OTHER): Payer: 59 | Admitting: Plastic Surgery

## 2020-07-14 ENCOUNTER — Other Ambulatory Visit: Payer: Self-pay

## 2020-07-14 DIAGNOSIS — S6991XA Unspecified injury of right wrist, hand and finger(s), initial encounter: Secondary | ICD-10-CM

## 2020-07-14 MED ORDER — CELECOXIB 200 MG PO CAPS: 200.0000 mg | ORAL_CAPSULE | Freq: Two times a day (BID) | ORAL | 1 refills | Status: DC

## 2020-07-14 NOTE — Progress Notes (Signed)
Patient presents 2 weeks postop from treatment of near complete amputation of the right index fingertip just proximal to the DIP.  I did a pinning of the middle phalanx and extensor tendon repair and laceration repair.  He did have some pain initially but this has been improving over the past few days.  On exam the skin looks to be mostly intact.  There is a portion of the distal fingertip that may have some vascular compromise but this is a relatively small area.  Otherwise the majority of the distal tip looks to be well perfused.  The pins are in place without sign of any infection.  Laceration seems to be healing appropriately.  I am going to send him to hand therapy for a hand-based splint to keep his index finger extended while the pins are in place.  I will plan to see him back in 2 to 3 weeks to evaluate for removal of the pins.  All his questions were answered.  I will give him a Celebrex prescription to try to help with the pain.

## 2020-07-23 ENCOUNTER — Telehealth: Payer: Self-pay | Admitting: Occupational Therapy

## 2020-07-23 NOTE — Telephone Encounter (Signed)
Dr. Arita Miss, Kent Brooks is scheduled for splinting on 07/27/20.   Should we place him in a safe position splint with MP's flexed and IP's extended?(Or if you would prefer something else please let me know).  Would you like all digits included or just the index and middle finger?  How long do you anticipate he will be immobilized before beginning therapy for A/ROM?  Thanks for your assistance, Keene Breath, OTR/L

## 2020-07-27 ENCOUNTER — Ambulatory Visit: Payer: 59 | Admitting: Occupational Therapy

## 2020-07-28 ENCOUNTER — Ambulatory Visit: Payer: 59 | Attending: Plastic Surgery | Admitting: Occupational Therapy

## 2020-07-28 ENCOUNTER — Other Ambulatory Visit: Payer: Self-pay

## 2020-07-28 DIAGNOSIS — R600 Localized edema: Secondary | ICD-10-CM | POA: Insufficient documentation

## 2020-07-28 DIAGNOSIS — M6281 Muscle weakness (generalized): Secondary | ICD-10-CM | POA: Insufficient documentation

## 2020-07-28 DIAGNOSIS — M25641 Stiffness of right hand, not elsewhere classified: Secondary | ICD-10-CM | POA: Diagnosis not present

## 2020-07-28 DIAGNOSIS — M79641 Pain in right hand: Secondary | ICD-10-CM | POA: Insufficient documentation

## 2020-07-28 DIAGNOSIS — R278 Other lack of coordination: Secondary | ICD-10-CM | POA: Insufficient documentation

## 2020-07-28 NOTE — Patient Instructions (Signed)
  WEARING SCHEDULE:  Wear splint at ALL times except for hygiene care (May remove splint for exercises and then immediately place back on ONLY if directed by the therapist)  PURPOSE:  To prevent movement and for protection until injury can heal  CARE OF SPLINT:  Keep splint away from heat sources including: stove, radiator or furnace, or a car in sunlight. The splint can melt and will no longer fit you properly  Keep away from pets and children  Clean the splint with rubbing alcohol 1-2 times per day.  Clean base of pins with Q-tips and hydrogen peroxide * During this time, make sure you also clean your hand/arm as instructed by your therapist and/or perform dressing changes as needed. Then dry hand/arm completely before replacing splint. (When cleaning hand/arm, keep it immobilized in same position until splint is replaced)  PRECAUTIONS/POTENTIAL PROBLEMS: *If you notice or experience increased pain, swelling, numbness, or a lingering reddened area from the splint: Contact your therapist immediately by calling [phone]. You must wear the splint for protection, but we will get you scheduled for adjustments as quickly as possible.  (If only straps or hooks need to be replaced and NO adjustments to the splint need to be made, just call the office ahead and let them know you are coming in)  If you have any medical concerns or signs of infection, please call your doctor immediately  Try to move thumb and last 2 digits as much as able INSIDE splint!!

## 2020-07-28 NOTE — Therapy (Signed)
[facility] [address] [place], [place], [address] Phone: [phone]   Fax:  [phone]  Occupational Therapy Evaluation  Patient Details  Name: Kent Brooks MRN: [id number] Date of Birth: 30-Oct-1965 Referring Provider (OT): Dr. Arita Miss   Encounter Date: 07/28/2020   OT End of Session - 07/28/20 1526     Visit Number 1    Number of Visits 16    Date for OT Re-Evaluation 09/28/20    Authorization Type [employer] - VL 30 combined per year    OT Start Time 1015    OT Stop Time 1120    OT Time Calculation (min) 65 min    Activity Tolerance Patient tolerated treatment well    Behavior During Therapy [facility] for tasks assessed/performed             Past Medical History:  Diagnosis Date   Anxiety    Asthma    childhood asthma   Depression    GERD (gastroesophageal reflux disease)    History of hiatal hernia    Sleep apnea    C-PAP    Past Surgical History:  Procedure Laterality Date   AMPUTATION Right 07/01/2020   Procedure: REVISION AMPUTATION OF INDEX FINGER;  Surgeon: Allena Napoleon, MD;  [facility];  Service: Plastics;  Laterality: Right;   CHOLECYSTECTOMY N/A 10/12/2015   Procedure: LAPAROSCOPIC CHOLECYSTECTOMY;  Surgeon: Geoffry Paradise, MD;  Location: [facility] ORS;  Service: General;  Laterality: N/A;   HIATAL HERNIA REPAIR N/A 10/12/2015   Procedure: LAPAROSCOPIC REPAIR OF HIATAL HERNIA;  Surgeon: Geoffry Paradise, MD;  Location: [facility] ORS;  Service: General;  Laterality: N/A;   KNEE ARTHROSCOPY W/ ACL RECONSTRUCTION Right    LAPAROSCOPIC GASTRIC SLEEVE RESECTION N/A 10/12/2015   Procedure: LAPAROSCOPIC GASTRIC SLEEVE RESECTION;  Surgeon: Geoffry Paradise, MD;  Location: [facility] ORS;  Service: General;  Laterality: N/A;   PERCUTANEOUS PINNING Right 07/01/2020   Procedure: POSSIBLE PERCUTANEOUS PINNING EXTREMITY;  Surgeon: Allena Napoleon, MD;  Location: [facility] OR;  Service: Plastics;  Laterality: Right;   SHOULDER SURGERY  Right    SHOULDER SURGERY Left 2010   [facility], [place]    There were no vitals filed for this visit.   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    Limitations No ROM to index or long finger at this time    Currently in Pain? No/denies               [facility] OT Assessment - 07/28/20 0001       Assessment   Medical Diagnosis percutaneous pinning Rt index middle phalanx fx, extensor tendon repair Rt index finger, partial amputation    Referring Provider (OT) Dr. 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 Reviewed splint wear and care and issued.                   OT Education - 07/28/20 1227     Education Details precautions, hygiene care, pin site care, splint wear and care    Person(s) Educated Patient    Methods Explanation;Demonstration;Handout    Comprehension Verbalized understanding              OT Short Term Goals - 07/28/20 1550       OT SHORT TERM GOAL #1   Title Independent with splint wear and care    Time 4    Period Weeks    Status On-going      OT SHORT TERM GOAL #2   Title Independent with scar massage and desensitization techniques    Time 4    Period Weeks    Status New      OT SHORT TERM GOAL #3   Title Independent with HEP for ROM    Time 4    Period Weeks    Status New               OT Long Term Goals - 07/28/20 1551       OT LONG TERM GOAL #1   Title Independent with strengthening HEP    Time 8    Period Weeks    Status New      OT LONG TERM GOAL #2   Title Pt to demo ROM WFL's for thumb and digits 3-5 and index PIP flex to 90* or greater    Time 8    Period Weeks    Status New      OT LONG TERM GOAL #3   Title Pt to demo 30 lbs or greater grip strength Rt hand for work tasks    Time 8    Period Weeks    Status New      OT LONG TERM GOAL #4   Title Pt to return to using Rt hand as dominant hand for ADLS including writing    Time 8    Period Weeks    Status New                   Plan - 07/28/20 1530     Clinical Impression Statement Pt is a 55 y.o. 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 Pt presents today for O.T. evaluation and splinting purposes. Pt will benfit from continued O.T. to make splinting adjustments prn,  progress to ROM and strengthening when able, and return pt to use of Rt dominant hand.    OT Occupational Profile and History Problem Focused Assessment - Including review of records relating to presenting problem    Occupational performance deficits (Please refer to evaluation for details): ADL's;IADL's;Work;Leisure    Body Structure / Function / Physical Skills ADL;Pain;UE functional use;IADL;ROM;Scar mobility;Coordination;Sensation;FMC;Strength    Rehab Potential Good    Clinical Decision Making Limited treatment options, no task modification necessary    Comorbidities Affecting Occupational Performance: May have comorbidities impacting occupational performance    Modification or Assistance to Complete Evaluation  No modification of tasks or assist necessary to complete eval    OT Frequency 2x / week   may only initially be seen 1x/wk   OT Duration 8 weeks    OT Treatment/Interventions Self-care/ADL training;Moist Heat;Fluidtherapy;DME and/or AE instruction;Splinting;Compression bandaging;Therapeutic activities;Ultrasound;Therapeutic exercise;Scar mobilization;Cryotherapy;Passive range of motion;Electrical Stimulation;Paraffin;Manual Therapy;Patient/family education    Plan splinting adjustments prn, progress to ROM when able (pt reported he may be out of town next 2 weeks but will come back to see MD on 08/04/20 - therapist encouraged him to ask MD to show ex's if cleared since he will not be coming to therapy for 2 weeks)    Consulted and Agree with Plan of Care Patient             Patient will benefit from skilled therapeutic intervention in order to improve the following deficits and impairments:   Body Structure / Function / Physical Skills: ADL, Pain, UE functional use, IADL, ROM, Scar mobility, Coordination, Sensation, FMC, Strength       Visit Diagnosis: Stiffness of finger joint of right hand  Stiffness of right hand, not elsewhere classified  Pain in right hand  Muscle  weakness (generalized)  Other lack of coordination  Localized edema    Problem List Patient Active Problem List   Diagnosis Date Noted   Varicose veins with pain 07/28/2019   OSA (obstructive sleep apnea) 10/09/2018   Morbid obesity (HCC) 10/12/2015   Anemia of chronic disease 08/03/2015   Depression with anxiety 08/03/2015   GERD without esophagitis 08/03/2015   HTN, goal below 140/80 08/03/2015   Hyperlipidemia, mixed 08/03/2015    Kelli Churn, OTR/L 07/28/2020, 3:54 PM  [facility] [address] [place], [place], [address] Phone: [phone]   Fax:  [phone]  Name: Kent Brooks MRN: [id number] Date of Birth: [birth year]

## 2020-07-29 ENCOUNTER — Ambulatory Visit
Admission: RE | Admit: 2020-07-29 | Discharge: 2020-07-29 | Disposition: A | Discharge status: Home / Self Care | Payer: 59 | Attending: Plastic Surgery | Admitting: Plastic Surgery

## 2020-07-29 ENCOUNTER — Ambulatory Visit
Admission: RE | Admit: 2020-07-29 | Discharge: 2020-07-29 | Disposition: A | Discharge status: Home / Self Care | Payer: 59 | Source: Ambulatory Visit | Attending: Plastic Surgery | Admitting: Plastic Surgery

## 2020-07-29 DIAGNOSIS — S6991XA Unspecified injury of right wrist, hand and finger(s), initial encounter: Secondary | ICD-10-CM | POA: Insufficient documentation

## 2020-08-04 ENCOUNTER — Ambulatory Visit (INDEPENDENT_AMBULATORY_CARE_PROVIDER_SITE_OTHER): Payer: 59 | Admitting: Plastic Surgery

## 2020-08-04 ENCOUNTER — Other Ambulatory Visit: Payer: Self-pay

## 2020-08-04 DIAGNOSIS — S6991XA Unspecified injury of right wrist, hand and finger(s), initial encounter: Secondary | ICD-10-CM

## 2020-08-04 NOTE — Progress Notes (Signed)
Patient presents about a month postop from right index finger trauma.  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  I performed percutaneous pinning and repair of all structures that I could repair.  Things have gone reasonably well and recent x-ray shows bridging along the radial aspect of the fracture site.  On exam the fingertip remains well-perfused with some persistent scabbing along the suture line which is near circumferential.  I did remove the pins today.  We will plan to have Kent Brooks wear splint for another week and then transition to therapy and work on range of motion starting next week.  I will plan to see Kent Brooks again in around a month or so.  All of his questions were answered.

## 2020-08-16 ENCOUNTER — Ambulatory Visit: Payer: 59 | Admitting: Occupational Therapy

## 2020-08-16 ENCOUNTER — Ambulatory Visit: Payer: 59 | Attending: Plastic Surgery | Admitting: Occupational Therapy

## 2020-08-16 DIAGNOSIS — M79641 Pain in right hand: Secondary | ICD-10-CM | POA: Insufficient documentation

## 2020-08-16 DIAGNOSIS — M6281 Muscle weakness (generalized): Secondary | ICD-10-CM | POA: Diagnosis present

## 2020-08-16 DIAGNOSIS — M25641 Stiffness of right hand, not elsewhere classified: Secondary | ICD-10-CM | POA: Diagnosis present

## 2020-08-16 DIAGNOSIS — R278 Other lack of coordination: Secondary | ICD-10-CM | POA: Diagnosis present

## 2020-08-16 DIAGNOSIS — R600 Localized edema: Secondary | ICD-10-CM | POA: Diagnosis present

## 2020-08-16 NOTE — Therapy (Signed)
[facility] [address], [place], [address] Phone: [phone]   Fax:  [phone]  Occupational Therapy Treatment  Patient Details  Name: Kent Brooks MRN: [id number] Date of Birth: 07-11-1965 Referring Provider (OT): Dr. Arita Miss   Encounter Date: 08/16/2020   OT End of Session - 08/16/20 1958     Visit Number 2    Number of Visits 16    Date for OT Re-Evaluation 09/28/20    Authorization Type [employer] - VL 30 combined per year    OT Start Time 0905    OT Stop Time 0953    OT Time Calculation (min) 48 min    Activity Tolerance Patient tolerated treatment well    Behavior During Therapy [facility] for tasks assessed/performed             Past Medical History:  Diagnosis Date   Anxiety    Asthma    childhood asthma   Depression    GERD (gastroesophageal reflux disease)    History of hiatal hernia    Sleep apnea    C-PAP    Past Surgical History:  Procedure Laterality Date   AMPUTATION Right 07/01/2020   Procedure: REVISION AMPUTATION OF INDEX FINGER;  Surgeon: Allena Napoleon, MD;  [facility]: [facility];  Service: Plastics;  Laterality: Right;   CHOLECYSTECTOMY N/A 10/12/2015   Procedure: LAPAROSCOPIC CHOLECYSTECTOMY;  Surgeon: Geoffry Paradise, MD;  Location: [facility] ORS;  Service: General;  Laterality: N/A;   HIATAL HERNIA REPAIR N/A 10/12/2015   Procedure: LAPAROSCOPIC REPAIR OF HIATAL HERNIA;  Surgeon: Geoffry Paradise, MD;  Location: [facility] ORS;  Service: General;  Laterality: N/A;   KNEE ARTHROSCOPY W/ ACL RECONSTRUCTION Right    LAPAROSCOPIC GASTRIC SLEEVE RESECTION N/A 10/12/2015   Procedure: LAPAROSCOPIC GASTRIC SLEEVE RESECTION;  Surgeon: Geoffry Paradise, MD;  Location: [facility] ORS;  Service: General;  Laterality: N/A;   PERCUTANEOUS PINNING Right 07/01/2020   Procedure: POSSIBLE PERCUTANEOUS PINNING EXTREMITY;  Surgeon: Allena Napoleon, MD;  Location: [facility];  Service: Plastics;  Laterality: Right;   SHOULDER SURGERY  Right    SHOULDER SURGERY Left 2010   [facility], [place]    There were no vitals filed for this visit.   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    Currently in Pain? Yes                [facility] OT Assessment - 08/16/20 0001       Edema   Edema 1 to 1.2 cm increase at prox an PIP      AROM   Right Wrist Extension 64 Degrees    Right Wrist Flexion 95 Degrees    Left Wrist Extension 70 Degrees    Left Wrist Flexion 70 Degrees      Right Hand AROM   R Index  MCP 0-90 45 Degrees    R Index PIP 0-100 15 Degrees  R Index DIP 0-70 --   NT - took scabs off yesterday/ -10 ext   R Long  MCP 0-90 75 Degrees    R Long PIP 0-100 85 Degrees    R Ring  MCP 0-90 65 Degrees    R Ring PIP 0-100 95 Degrees    R Little  MCP 0-90 80 Degrees    R Little PIP 0-100 90 Degrees                      OT Treatments/Exercises (OP) - 08/16/20 0001       Modalities   Modalities Contrast Bath      LUE Contrast Bath   Time 8 minutes    Comments stockinette over 2nd digit- pt to do at home prior to ROM             Pt ed on edema control- use coban wrap over cotton digi sleeve- lightly at night time  And tomorrow during day can do DIP and middle phalanges only  Can use on and off buddy strap with 3rd to facilitate use of 2nd digit in light activities over coban  Tendon glides - block - MC flexion and gentle PIP flexion and composite to foam block  Opposition pick up small foam bock of 1-2 cm  alternate digits  10 reps  3-5 x day after contrast  And keep pain under 2/10  Light pull or stretch        OT Education - 08/16/20 1957     Education Details findings of ROM assessment and edema and HEP    Person(s) Educated Patient    Methods Explanation;Demonstration;Tactile cues;Verbal cues;Handout    Comprehension Verbalized understanding              OT Short Term Goals - 07/28/20 1550       OT SHORT TERM GOAL #1   Title Independent with splint wear and care    Time 4    Period Weeks    Status On-going      OT SHORT TERM GOAL #2   Title Independent with scar massage and desensitization techniques    Time 4    Period Weeks    Status New      OT SHORT TERM GOAL #3   Title Independent with HEP for ROM    Time 4    Period Weeks    Status New               OT Long Term Goals - 07/28/20 1551       OT LONG TERM GOAL #1   Title Independent with strengthening HEP    Time 8    Period Weeks    Status New      OT LONG TERM GOAL #2   Title Pt to demo ROM WFL's for thumb and digits 3-5 and index PIP flex to 90* or greater    Time 8    Period Weeks    Status New      OT LONG TERM GOAL #3   Title Pt to demo 30 lbs or greater grip strength Rt hand for work tasks    Time 8    Period Weeks    Status New      OT LONG TERM GOAL #4   Title Pt to return to using Rt hand as dominant hand for ADLS including writing    Time 8    Period Weeks  Status New                   Plan - 08/16/20 1959     Clinical Impression Statement Pt is a 55 y.o. 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 Pt was evaluated 2 wks ago and now return at about 8 1/2 wks s/p for initiation of ROM. Incision still little tender and open - pt removed last scabs yesterday- pt with edema  of 1 to 1.2 cm at prox and middle phalanges of L 2nd digit - decrease AROM in 2nd digit more than 3rd thru 5th- in need for scar mobilization in few days , edema control and AROM , then progress to PROM and strengthening to be able to use hand in sports , playing with kids and occupation as Theme park manager. Pt will benfit from continued O.T. to make splinting adjustments prn, progress to ROM and strengthening when able, and return pt to use of Rt dominant hand.    OT Occupational Profile and History Problem Focused Assessment - Including review of records relating to presenting problem    Occupational performance deficits (Please refer to evaluation for details): ADL's;IADL's;Work;Leisure    Body Structure / Function / Physical Skills ADL;Pain;UE functional use;IADL;ROM;Scar mobility;Coordination;Sensation;FMC;Strength    Rehab Potential Good    Clinical Decision Making Limited treatment options, no task modification necessary    Comorbidities Affecting Occupational Performance: May have comorbidities impacting occupational performance    Modification or Assistance to Complete Evaluation  No modification of tasks or assist necessary to complete eval    OT Frequency 2x / week    OT Duration 6 weeks    OT Treatment/Interventions Self-care/ADL training;Moist Heat;Fluidtherapy;DME and/or AE instruction;Splinting;Compression bandaging;Therapeutic activities;Ultrasound;Therapeutic exercise;Scar mobilization;Cryotherapy;Passive range of motion;Electrical Stimulation;Paraffin;Manual Therapy;Patient/family education    Consulted and Agree with Plan of Care Patient             Patient will benefit from skilled therapeutic intervention in order to improve the following deficits and impairments:   Body Structure / Function / Physical Skills: ADL, Pain, UE functional use, IADL, ROM, Scar mobility, Coordination, Sensation, FMC, Strength       Visit Diagnosis: Stiffness of finger joint of right  hand  Stiffness of right hand, not elsewhere classified  Pain in right hand  Muscle weakness (generalized)  Other lack of coordination  Localized edema    Problem List Patient Active Problem List   Diagnosis Date Noted   Varicose veins with pain 07/28/2019   OSA (obstructive sleep apnea) 10/09/2018   Morbid obesity (HCC) 10/12/2015   Anemia of chronic disease 08/03/2015   Depression with anxiety 08/03/2015   GERD without esophagitis 08/03/2015   HTN, goal below 140/80 08/03/2015   Hyperlipidemia, mixed 08/03/2015    Jearlene Bridwell OTR/L,CLT 08/16/2020, 8:10 PM  [facility] [address], [place], [address] Phone: [phone]   Fax:  [phone]  Name: Kent Brooks MRN: [id number] Date of Birth: [birth year]

## 2020-08-18 ENCOUNTER — Encounter: Payer: 59 | Admitting: Occupational Therapy

## 2020-08-23 ENCOUNTER — Ambulatory Visit: Payer: 59 | Admitting: Occupational Therapy

## 2020-08-23 DIAGNOSIS — M79641 Pain in right hand: Secondary | ICD-10-CM

## 2020-08-23 DIAGNOSIS — M6281 Muscle weakness (generalized): Secondary | ICD-10-CM

## 2020-08-23 DIAGNOSIS — M25641 Stiffness of right hand, not elsewhere classified: Secondary | ICD-10-CM

## 2020-08-23 DIAGNOSIS — R600 Localized edema: Secondary | ICD-10-CM

## 2020-08-23 DIAGNOSIS — R278 Other lack of coordination: Secondary | ICD-10-CM

## 2020-08-23 NOTE — Therapy (Signed)
[facility] [address] [place], [address] Phone: [phone]   Fax:  [phone]  Occupational Therapy Treatment  Patient Details  Name: Kent Brooks MRN: [id number] Date of Birth: June 11, 1965 Referring Provider (OT): Dr. Arita Miss   Encounter Date: 08/23/2020   OT End of Session - 08/23/20 1156     Visit Number 3    Number of Visits 16    Date for OT Re-Evaluation 09/28/20    Authorization Type [employer] - VL 30 combined per year    OT Start Time 0945    OT Stop Time 1028    OT Time Calculation (min) 43 min    Activity Tolerance Patient tolerated treatment well    Behavior During Therapy [facility] for tasks assessed/performed             Past Medical History:  Diagnosis Date   Anxiety    Asthma    childhood asthma   Depression    GERD (gastroesophageal reflux disease)    History of hiatal hernia    Sleep apnea    C-PAP    Past Surgical History:  Procedure Laterality Date   AMPUTATION Right 07/01/2020   Procedure: REVISION AMPUTATION OF INDEX FINGER;  Surgeon: Allena Napoleon, MD;  Location: [facility] OR;  Service: Plastics;  Laterality: Right;   CHOLECYSTECTOMY N/A 10/12/2015   Procedure: LAPAROSCOPIC CHOLECYSTECTOMY;  Surgeon: Geoffry Paradise, MD;  Location: [facility] ORS;  Service: General;  Laterality: N/A;   HIATAL HERNIA REPAIR N/A 10/12/2015   Procedure: LAPAROSCOPIC REPAIR OF HIATAL HERNIA;  Surgeon: Geoffry Paradise, MD;  Location: [facility] ORS;  Service: General;  Laterality: N/A;   KNEE ARTHROSCOPY W/ ACL RECONSTRUCTION Right    LAPAROSCOPIC GASTRIC SLEEVE RESECTION N/A 10/12/2015   Procedure: LAPAROSCOPIC GASTRIC SLEEVE RESECTION;  Surgeon: Geoffry Paradise, MD;  Location: [facility] ORS;  Service: General;  Laterality: N/A;   PERCUTANEOUS PINNING Right 07/01/2020   Procedure: POSSIBLE PERCUTANEOUS PINNING EXTREMITY;  Surgeon: Allena Napoleon, MD;  Location: [facility] OR;  Service: Plastics;  Laterality: Right;   SHOULDER  SURGERY Right    SHOULDER SURGERY Left 2010   [facility], [place]    There were no vitals filed for this visit.   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    Currently in Pain? No/denies                [facility] OT Assessment - 08/23/20 0001       Right Hand AROM   R Index  MCP 0-90 60 Degrees   85 in session   R Index PIP 0-100 45 Degrees   55 in session   R Index DIP 0-70 --   NT yet   R Long  MCP 0-90 80 Degrees    R Ring  MCP 0-90 85 Degrees    R Little  MCP 0-90 80 Degrees  SHow increase AROM in all digits- increase MC more than PIP at 2nd - no scabs - incision close Tolerate massage - ed on scar massage and mobs - and fitted with silicon compression sleeve  And to take off with HEP  Tolerate soft textures - but working on tapping on thigh and towel - with silicon sleeve should be able to type on computer and cash register           OT Treatments/Exercises (OP) - 08/23/20 0001       RUE Fluidotherapy   Number Minutes Fluidotherapy 8 Minutes    RUE Fluidotherapy Location Hand;Wrist    Comments AROM for fisting, wrist and opposition prior to soft tissue and AROM              Review again edema control- use silicon sleeve - ed on precautions  Can use on and off buddy strap with 3rd to facilitate use of 2nd digit 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   Tendon glides - block - MC flexion and gentle PIP flexion and composite to foam block  But block wrist - maintain  wrist extention  Opposition to all digits   10 reps  3-5 x day after contrast  Wrist extention stretch in between  And keep pain under 2/10  Light pull or stretch  Ed on using palms or forearm to pick up or carry large items, enlarge grips - to prevent wrist flexion during gripping activites - over use of 5th and 4th flexion - and wrist flexion - causing some volar wrist pain  Better this date         OT Education - 08/23/20 1156     Education Details progress and changes to HEP    Person(s) Educated Patient    Methods Explanation;Demonstration;Tactile cues;Verbal cues;Handout    Comprehension Verbalized understanding              OT Short Term Goals - 07/28/20 1550       OT SHORT TERM GOAL #1   Title Independent with splint wear and care    Time 4    Period Weeks    Status On-going      OT SHORT TERM GOAL #2   Title Independent with scar massage and desensitization techniques    Time 4    Period Weeks    Status New      OT SHORT TERM GOAL #3   Title Independent with HEP for ROM    Time 4    Period Weeks    Status New               OT Long Term Goals - 07/28/20 1551       OT LONG TERM GOAL #1   Title Independent with strengthening HEP    Time 8    Period Weeks    Status New      OT LONG TERM GOAL #2   Title Pt to demo ROM WFL's for thumb and digits 3-5 and index PIP flex to 90* or greater    Time 8    Period Weeks    Status New      OT LONG TERM GOAL #3   Title Pt to demo 30 lbs or greater grip strength Rt hand for work tasks    Time 8    Period Weeks    Status New      OT LONG TERM GOAL #4   Title Pt to return  to using Rt hand as dominant hand for ADLS including writing    Time 8    Period Weeks    Status New                   Plan - 08/23/20 1157     Clinical Impression Statement Pt is a 55 y.o. 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 Pt was evaluated 3 wks ago and now return at about 7 1/2 wks s/p for initiation of ROM. 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 Pt will benfit from continued O.T. to make splinting adjustments prn, progress to ROM and strengthening when able, and return pt to use of Rt dominant hand.    OT Occupational Profile and History Problem Focused Assessment - Including review of records relating to presenting problem    Occupational performance deficits (Please refer to evaluation for details): ADL's;IADL's;Work;Leisure    Body Structure / Function / Physical Skills ADL;Pain;UE functional use;IADL;ROM;Scar mobility;Coordination;Sensation;FMC;Strength    Rehab Potential Good    Clinical Decision Making Limited treatment options, no task modification necessary    Comorbidities Affecting Occupational Performance: May have comorbidities impacting occupational performance    Modification or Assistance to Complete Evaluation  No modification of tasks or assist necessary to complete eval    OT Frequency 2x / week    OT Duration 6 weeks    OT Treatment/Interventions Self-care/ADL training;Moist Heat;Fluidtherapy;DME and/or AE instruction;Splinting;Compression bandaging;Therapeutic activities;Ultrasound;Therapeutic exercise;Scar mobilization;Cryotherapy;Passive range of motion;Electrical  Stimulation;Paraffin;Manual Therapy;Patient/family education    Consulted and Agree with Plan of Care Patient             Patient will benefit from skilled therapeutic intervention in order to improve the following deficits and impairments:   Body Structure / Function / Physical Skills: ADL, Pain, UE functional use, IADL, ROM, Scar mobility, Coordination, Sensation, FMC, Strength       Visit Diagnosis: Stiffness of finger joint of right hand  Stiffness of right hand, not elsewhere classified  Pain in right hand  Muscle weakness (generalized)  Other lack of coordination  Localized edema    Problem List Patient Active Problem List   Diagnosis Date Noted   Varicose veins with pain 07/28/2019   OSA (obstructive sleep apnea) 10/09/2018   Morbid obesity (HCC) 10/12/2015   Anemia of chronic disease 08/03/2015   Depression with anxiety 08/03/2015   GERD without esophagitis 08/03/2015   HTN, goal below 140/80 08/03/2015   Hyperlipidemia, mixed 08/03/2015    Oletta Cohn OTR/L,CLT 08/23/2020, 12:03 PM  [facility] [address] [place], [address] Phone: [phone]   Fax:  [phone]  Name: Eyal Greenhaw MRN: [id number] Date of  Birth: 05-09-1965

## 2020-08-25 ENCOUNTER — Encounter: Payer: 59 | Admitting: Occupational Therapy

## 2020-08-27 ENCOUNTER — Encounter: Payer: 59 | Admitting: Occupational Therapy

## 2020-08-30 ENCOUNTER — Ambulatory Visit: Payer: 59 | Admitting: Occupational Therapy

## 2020-08-30 ENCOUNTER — Encounter: Payer: 59 | Admitting: Occupational Therapy

## 2020-08-30 DIAGNOSIS — M25641 Stiffness of right hand, not elsewhere classified: Secondary | ICD-10-CM | POA: Diagnosis not present

## 2020-08-30 DIAGNOSIS — M79641 Pain in right hand: Secondary | ICD-10-CM

## 2020-08-30 DIAGNOSIS — R278 Other lack of coordination: Secondary | ICD-10-CM

## 2020-08-30 DIAGNOSIS — M6281 Muscle weakness (generalized): Secondary | ICD-10-CM

## 2020-08-30 DIAGNOSIS — R600 Localized edema: Secondary | ICD-10-CM

## 2020-08-30 NOTE — Therapy (Signed)
[facility] [address] [place], [address] Phone: [phone]   Fax:  [phone]  Occupational Therapy Treatment  Patient Details  Name: Kent Brooks MRN: [id number] Date of Birth: [birth year] Referring Provider (OT): Dr. Arita Miss   Encounter Date: 08/30/2020   OT End of Session - 08/30/20 1010     Visit Number 4    Number of Visits 16    Date for OT Re-Evaluation 09/28/20    OT Start Time 0950    OT Stop Time 1028    OT Time Calculation (min) 38 min    Activity Tolerance Patient tolerated treatment well    Behavior During Therapy [facility] for tasks assessed/performed             Past Medical History:  Diagnosis Date   Anxiety    Asthma    childhood asthma   Depression    GERD (gastroesophageal reflux disease)    History of hiatal hernia    Sleep apnea    C-PAP    Past Surgical History:  Procedure Laterality Date   AMPUTATION Right 07/01/2020   Procedure: REVISION AMPUTATION OF INDEX FINGER;  Surgeon: Allena Napoleon, MD;  Location: [facility];  Service: Plastics;  Laterality: Right;   CHOLECYSTECTOMY N/A 10/12/2015   Procedure: LAPAROSCOPIC CHOLECYSTECTOMY;  Surgeon: Geoffry Paradise, MD;  Location: [facility] ORS;  Service: General;  Laterality: N/A;   HIATAL HERNIA REPAIR N/A 10/12/2015   Procedure: LAPAROSCOPIC REPAIR OF HIATAL HERNIA;  Surgeon: Geoffry Paradise, MD;  Location: [facility] ORS;  Service: General;  Laterality: N/A;   KNEE ARTHROSCOPY W/ ACL RECONSTRUCTION Right    LAPAROSCOPIC GASTRIC SLEEVE RESECTION N/A 10/12/2015   Procedure: LAPAROSCOPIC GASTRIC SLEEVE RESECTION;  Surgeon: Geoffry Paradise, MD;  Location: [facility] ORS;  Service: General;  Laterality: N/A;   PERCUTANEOUS PINNING Right 07/01/2020   Procedure: POSSIBLE PERCUTANEOUS PINNING EXTREMITY;  Surgeon: Allena Napoleon, MD;  Location: [facility];  Service: Plastics;  Laterality: Right;   SHOULDER SURGERY Right    SHOULDER SURGERY Left 2010   [facility], [place]    There were no vitals filed for this visit.   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    Currently in Pain? No/denies                [facility] OT Assessment - 08/30/20 0001       Edema   Edema DIp increase 1 cm , middle phalange 0.5 cm      Right Hand AROM   R Index  MCP 0-90 85 Degrees    R Index PIP 0-100 60 Degrees   0 ext             SHow increase AROM  and decrease discomfort in wrist -  Increase MC flexion  2nd digit MC to 85, PIP 60 - great progress - no scabs - incision closed -but pulled some stiches still out per pt  Tolerate massage - ed on scar massage and mobs - and fitted with silicon compression sleeve  Tolerate  soft textures -  and working on rougher textures, tapping , vibration with toothbrush  Force himself to use in holding cup, working dough at work and tie shoes- do not need to wear buddy strap as much           OT Treatments/Exercises (OP) - 08/30/20 0001       RUE Fluidotherapy   Number Minutes Fluidotherapy 8 Minutes    RUE Fluidotherapy Location Hand;Wrist    Comments AROM for for wrist and digits               Review again edema control- use silicon sleeve - ed on precautions  Can use on and off during day and then night time    Tendon glides - block - MC flexion and PIP flexion and composite to 2 finger out of palm But block wrist - maintain wrist extention  Opposition to all digits   10 reps  3-5 x day after contrast  Wrist extention stretch in between Add this date gentle PROM for PIP flexion- during intrinsic fist -  or when composite fist - block MC with pen at 90 -slight pull less than 2/10    And keep pain under 2/10  Light pull or stretch  Cont using palms or forearm to pick up or carry large items, enlarge grips - to prevent wrist flexion during gripping activites - over use of 5th and 4th flexion - and wrist flexion - causing some volar wrist pain  last time- but better this date           OT Education - 08/30/20 1010     Education Details progress and changes to HEP    Person(s) Educated Patient    Methods Explanation;Demonstration;Tactile cues;Verbal cues;Handout    Comprehension Verbalized understanding              OT Short Term Goals - 07/28/20 1550       OT SHORT TERM GOAL #1   Title Independent with splint wear and care    Time 4    Period Weeks    Status On-going      OT SHORT TERM GOAL #2   Title Independent with scar massage and desensitization techniques    Time 4    Period Weeks    Status New      OT SHORT TERM GOAL #3   Title Independent with HEP for ROM    Time 4    Period Weeks    Status New               OT Long Term Goals - 07/28/20 1551       OT LONG TERM GOAL #1   Title Independent with strengthening HEP    Time 8    Period Weeks    Status New      OT LONG TERM GOAL #2   Title Pt to demo ROM WFL's for thumb and digits 3-5 and index PIP flex to 90* or greater    Time 8    Period Weeks    Status New      OT LONG TERM GOAL #3   Title Pt to demo 30 lbs or greater grip strength Rt hand for work tasks    Time 8    Period Weeks    Status New      OT LONG TERM GOAL #4   Title Pt to return to using Rt hand as dominant hand for ADLS including writing  Time 8    Period Weeks    Status New                   Plan - 08/30/20 1011     Clinical Impression Statement Pt is a 55 y.o. 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 Pt will benfit from continued O.T. progress  ROM and strengthening , and return pt to use of Rt dominant hand.    OT Occupational Profile and History Problem Focused Assessment - Including review of records relating to presenting problem    Occupational performance deficits (Please refer to evaluation for details): ADL's;IADL's;Work;Leisure    Body Structure / Function / Physical Skills ADL;Pain;UE functional use;IADL;ROM;Scar mobility;Coordination;Sensation;FMC;Strength    Rehab Potential Good    Clinical Decision Making Limited treatment options, no task modification necessary    Comorbidities Affecting Occupational Performance: May have comorbidities impacting occupational performance    Modification or Assistance to Complete Evaluation  No modification of tasks or assist necessary to complete eval    OT Frequency 2x / week    OT Duration 6 weeks    OT Treatment/Interventions Self-care/ADL training;Moist Heat;Fluidtherapy;DME and/or AE instruction;Splinting;Compression bandaging;Therapeutic activities;Ultrasound;Therapeutic exercise;Scar mobilization;Cryotherapy;Passive range of motion;Electrical Stimulation;Paraffin;Manual Therapy;Patient/family education    Consulted and Agree with Plan of Care Patient             Patient will benefit from skilled therapeutic intervention in order to improve the  following deficits and impairments:   Body Structure / Function / Physical Skills: ADL, Pain, UE functional use, IADL, ROM, Scar mobility, Coordination, Sensation, FMC, Strength       Visit Diagnosis: Stiffness of right hand, not elsewhere classified  Pain in right hand  Other lack of coordination  Localized edema  Muscle weakness (generalized)  Stiffness of finger joint of right hand    Problem List Patient Active Problem List   Diagnosis Date Noted   Varicose veins with pain 07/28/2019   OSA (obstructive sleep apnea) 10/09/2018   Morbid obesity (HCC) 10/12/2015   Anemia of chronic disease 08/03/2015   Depression with anxiety 08/03/2015   GERD without esophagitis 08/03/2015   HTN, goal below 140/80 08/03/2015   Hyperlipidemia, mixed 08/03/2015    Oletta Cohn OTR/l,CLT 08/30/2020, 10:33 AM  [facility] [address] [place], [address] Phone: [phone]   Fax:  [phone]  Name: Kent Brooks MRN: [id number] Date of Birth: 1965/11/22

## 2020-09-01 ENCOUNTER — Encounter: Payer: 59 | Admitting: Occupational Therapy

## 2020-09-02 ENCOUNTER — Encounter: Payer: Self-pay | Admitting: Plastic Surgery

## 2020-09-02 ENCOUNTER — Other Ambulatory Visit: Payer: Self-pay

## 2020-09-02 ENCOUNTER — Ambulatory Visit: Payer: 59 | Admitting: Occupational Therapy

## 2020-09-02 ENCOUNTER — Ambulatory Visit (INDEPENDENT_AMBULATORY_CARE_PROVIDER_SITE_OTHER): Payer: 59 | Admitting: Plastic Surgery

## 2020-09-02 DIAGNOSIS — S6991XA Unspecified injury of right wrist, hand and finger(s), initial encounter: Secondary | ICD-10-CM

## 2020-09-02 NOTE — Progress Notes (Signed)
Patient presents about 2 months out from traumatic injury to his right index finger.  This was at the distal middle phalanx level.  The dorsal extensor mechanism and the bone were significantly disrupted.  Volar skin was injured but flexor tendons and neurovascular bundles appear to remain intact.  He feels quite good about how things are coming along.  He has been in therapy and is making progress with range of motion.  On exam everything is healed nicely.  There may still be some slight swelling to the fingertip but this is improving with time.  He reports hypersensitivity at the fingertip.  He can activate the FDP and FDS to the index finger and can near fully extend.  Flexion at the PIP and DIP are limited but he has good range of motion at the MP.  Given he is only 2 months out I think this is very good outcome given the complexity of his injury.  He will plan to continue with therapy and I will see him again in 3 months.  All of his questions were answered.

## 2020-09-06 ENCOUNTER — Encounter: Payer: 59 | Admitting: Occupational Therapy

## 2020-09-06 ENCOUNTER — Other Ambulatory Visit: Payer: Self-pay | Admitting: Plastic Surgery

## 2020-09-06 ENCOUNTER — Ambulatory Visit: Payer: 59 | Admitting: Occupational Therapy

## 2020-09-06 DIAGNOSIS — M6281 Muscle weakness (generalized): Secondary | ICD-10-CM

## 2020-09-06 DIAGNOSIS — R600 Localized edema: Secondary | ICD-10-CM

## 2020-09-06 DIAGNOSIS — M79641 Pain in right hand: Secondary | ICD-10-CM

## 2020-09-06 DIAGNOSIS — M25641 Stiffness of right hand, not elsewhere classified: Secondary | ICD-10-CM | POA: Diagnosis not present

## 2020-09-06 DIAGNOSIS — R278 Other lack of coordination: Secondary | ICD-10-CM

## 2020-09-06 NOTE — Telephone Encounter (Signed)
Prescription approved and sent back to the pharmacy.//AB/CMA

## 2020-09-06 NOTE — Therapy (Signed)
[place] [facility] [address] [place], [address] Phone: [phone]   Fax:  [phone]  Occupational Therapy Treatment  Patient Details  Name: Kent Brooks MRN: [id number] Date of Birth: [birth year] Referring Provider (OT): Dr. Arita Miss   Encounter Date: 09/06/2020   OT End of Session - 09/06/20 1049     Visit Number 5    Number of Visits 16    Date for OT Re-Evaluation 09/28/20    OT Start Time 1030    OT Stop Time 1122    OT Time Calculation (min) 52 min    Activity Tolerance Patient tolerated treatment well    Behavior During Therapy [facility] for tasks assessed/performed             Past Medical History:  Diagnosis Date   Anxiety    Asthma    childhood asthma   Depression    GERD (gastroesophageal reflux disease)    History of hiatal hernia    Sleep apnea    C-PAP    Past Surgical History:  Procedure Laterality Date   AMPUTATION Right 07/01/2020   Procedure: REVISION AMPUTATION OF INDEX FINGER;  Surgeon: Allena Napoleon, MD;  [facility];  Service: Plastics;  Laterality: Right;   CHOLECYSTECTOMY N/A 10/12/2015   Procedure: LAPAROSCOPIC CHOLECYSTECTOMY;  Surgeon: Geoffry Paradise, MD;  Location: [facility] ORS;  Service: General;  Laterality: N/A;   HIATAL HERNIA REPAIR N/A 10/12/2015   Procedure: LAPAROSCOPIC REPAIR OF HIATAL HERNIA;  Surgeon: Geoffry Paradise, MD;  Location: [facility] ORS;  Service: General;  Laterality: N/A;   KNEE ARTHROSCOPY W/ ACL RECONSTRUCTION Right    LAPAROSCOPIC GASTRIC SLEEVE RESECTION N/A 10/12/2015   Procedure: LAPAROSCOPIC GASTRIC SLEEVE RESECTION;  Surgeon: Geoffry Paradise, MD;  Location: [facility] ORS;  Service: General;  Laterality: N/A;   PERCUTANEOUS PINNING Right 07/01/2020   Procedure: POSSIBLE PERCUTANEOUS PINNING EXTREMITY;  Surgeon: Allena Napoleon, MD;  Location: [facility] OR;  Service: Plastics;  Laterality: Right;   SHOULDER SURGERY Right    SHOULDER SURGERY Left 2010   [facility], [place]    There were no vitals filed for this visit.   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    Currently in Pain? No/denies                [facility] OT Assessment - 09/06/20 0001       Right Hand AROM   R Index  MCP 0-90 88 Degrees    R Index PIP 0-100 60 Degrees            IN session got PROM 75 PIP and AROM 70 to 75 - MC 90    SHow increase AROM  and decrease discomfort in wrist -  Increase MC flexion  2nd digit MC to 88, PIP 60 - increase edema this date coming in 0.8 to 1.2 cm edema  - no scabs - incision closed -but pulled some stiches still out per pt  Tolerate massage -  ed on scar massage and mobs - and fitted with silicon compression sleeve for daytime - cut off tip to use on computer touch screen Tolerate soft textures -  and working on rougher textures, tapping , vibration with toothbrush  Force himself to use in holding cup, working dough at work and tie shoes- do not need to wear buddy strap as much                  Review again edema control Can use on and off during day and then night time    Tendon glides - block - MC flexion and PIP flexion and composite to 2 finger out of palm But block wrist - maintain wrist extention  Opposition to all digits   10 reps  3-5 x day after contrast  Wrist extention stretch in between Add this date gentle PROM for PIP flexion- and done prolonged flexion by OT  Blocked intrinsic fist - or when composite fist - block MC with pen at 90 -slight pull less than 2/10  PROM for DIP this date with  place and hold of DIP - add to HEP - but not over do - coban at night time maintain full extention of DIP    And keep pain under 2/10  Light pull or stretch  Cont using palms or forearm to pick up or carry large items, enlarge grips - to prevent wrist flexion during gripping activites - over use of 5th and 4th flexion  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, selling and decorate cake             OT Treatments/Exercises (OP) - 09/06/20 0001       RUE Fluidotherapy   Number Minutes Fluidotherapy 10 Minutes    RUE Fluidotherapy Location Hand;Wrist    Comments ice 2 x rotation fo swelling                    OT Education - 09/06/20 1048     Education Details progress and changes to HEP    Person(s) Educated Patient    Methods Explanation;Demonstration;Tactile cues;Verbal cues;Handout    Comprehension Verbalized understanding              OT Short Term Goals - 07/28/20 1550       OT SHORT TERM GOAL #1   Title Independent with splint wear and care    Time 4    Period Weeks    Status On-going      OT SHORT TERM GOAL #2   Title Independent with scar massage and desensitization techniques    Time 4    Period Weeks    Status New      OT SHORT TERM GOAL #3   Title Independent with HEP for ROM    Time 4    Period Weeks    Status New               OT Long Term Goals - 07/28/20 1551       OT LONG TERM GOAL #1   Title Independent with strengthening HEP    Time 8    Period Weeks    Status New      OT LONG TERM GOAL #2   Title Pt to demo ROM WFL's for thumb and digits 3-5 and index PIP flex to 90* or greater    Time 8    Period Weeks  Status New      OT LONG TERM GOAL #3   Title Pt to demo 30 lbs or greater grip strength Rt hand for work tasks    Time 8    Period Weeks    Status New      OT LONG TERM GOAL #4   Title Pt to return to using Rt hand as dominant hand for ADLS including writing    Time 8     Period Weeks    Status New                   Plan - 09/06/20 1052     Clinical Impression Statement Pt is a 55 y.o. 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 Pt will benfit from continued O.T. progress  ROM and strengthening , and return pt to use of Rt dominant hand.    OT Occupational Profile and History Problem Focused Assessment - Including review of records relating to presenting problem    Occupational performance deficits (Please refer to evaluation for details): ADL's;IADL's;Work;Leisure    Body Structure / Function / Physical Skills ADL;Pain;UE functional use;IADL;ROM;Scar mobility;Coordination;Sensation;FMC;Strength    Rehab Potential Good    Clinical Decision Making Limited treatment options, no task modification necessary    Comorbidities Affecting Occupational Performance: May have comorbidities impacting occupational performance    Modification or Assistance to Complete Evaluation  No modification of tasks or assist necessary to complete eval    OT Frequency 2x / week    OT Duration 6 weeks    OT Treatment/Interventions Self-care/ADL training;Moist  Heat;Fluidtherapy;DME and/or AE instruction;Splinting;Compression bandaging;Therapeutic activities;Ultrasound;Therapeutic exercise;Scar mobilization;Cryotherapy;Passive range of motion;Electrical Stimulation;Paraffin;Manual Therapy;Patient/family education    Consulted and Agree with Plan of Care Patient             Patient will benefit from skilled therapeutic intervention in order to improve the following deficits and impairments:   Body Structure / Function / Physical Skills: ADL, Pain, UE functional use, IADL, ROM, Scar mobility, Coordination, Sensation, FMC, Strength       Visit Diagnosis: Stiffness of right hand, not elsewhere classified  Pain in right hand  Other lack of coordination  Localized edema  Muscle weakness (generalized)    Problem List Patient Active Problem List   Diagnosis Date Noted   Varicose veins with pain 07/28/2019   OSA (obstructive sleep apnea) 10/09/2018   Morbid obesity (HCC) 10/12/2015   Anemia of chronic disease 08/03/2015   Depression with anxiety 08/03/2015   GERD without esophagitis 08/03/2015   HTN, goal below 140/80 08/03/2015   Hyperlipidemia, mixed 08/03/2015    Yolinda Duerr OTR/L,CLT 09/06/2020,  12:08 PM  [place] [facility] [address] [place], [place], [address] Phone: [phone]   Fax:  [phone]  Name: Kent Brooks MRN: [id number] Date of Birth: [birth year]

## 2020-09-14 ENCOUNTER — Ambulatory Visit: Payer: 59 | Attending: Plastic Surgery | Admitting: Occupational Therapy

## 2020-09-14 DIAGNOSIS — M6281 Muscle weakness (generalized): Secondary | ICD-10-CM | POA: Diagnosis present

## 2020-09-14 DIAGNOSIS — M25641 Stiffness of right hand, not elsewhere classified: Secondary | ICD-10-CM | POA: Insufficient documentation

## 2020-09-14 DIAGNOSIS — R600 Localized edema: Secondary | ICD-10-CM | POA: Insufficient documentation

## 2020-09-14 DIAGNOSIS — R278 Other lack of coordination: Secondary | ICD-10-CM | POA: Insufficient documentation

## 2020-09-14 DIAGNOSIS — M79641 Pain in right hand: Secondary | ICD-10-CM

## 2020-09-14 NOTE — Therapy (Signed)
[facility] [address] [place], [place], [address] Phone: [phone]   Fax:  [phone]  Occupational Therapy Treatment  Patient Details  Name: Kent Brooks MRN: [id number] Date of Birth: 1965/10/12 Referring Provider (OT): Dr. Arita MissPace   Encounter Date: 09/14/2020   OT End of Session - 09/14/20 1118     Visit Number 6    Number of Visits 16    Date for OT Re-Evaluation 09/28/20    OT Start Time 1032    OT Stop Time 1114    OT Time Calculation (min) 42 min    Activity Tolerance Patient tolerated treatment well    Behavior During [facility] for tasks assessed/performed             Past Medical History:  Diagnosis Date   Anxiety    Asthma    childhood asthma   Depression    GERD (gastroesophageal reflux disease)    History of hiatal hernia    Sleep apnea    C-PAP    Past Surgical History:  Procedure Laterality Date   AMPUTATION Right 07/01/2020   Procedure: REVISION AMPUTATION OF INDEX FINGER;  Surgeon: Allena NapoleonPace, Collier S, MD;  [facility];  Service: Plastics;  Laterality: Right;   CHOLECYSTECTOMY N/A 10/12/2015   Procedure: LAPAROSCOPIC CHOLECYSTECTOMY;  Surgeon: Geoffry ParadiseJon M Bruce, MD;  Location: [facility] ORS;  Service: General;  Laterality: N/A;   HIATAL HERNIA REPAIR N/A 10/12/2015   Procedure: LAPAROSCOPIC REPAIR OF HIATAL HERNIA;  Surgeon: Geoffry ParadiseJon M Bruce, MD;  Location: [facility] ORS;  Service: General;  Laterality: N/A;   KNEE ARTHROSCOPY W/ ACL RECONSTRUCTION Right    LAPAROSCOPIC GASTRIC SLEEVE RESECTION N/A 10/12/2015   Procedure: LAPAROSCOPIC GASTRIC SLEEVE RESECTION;  Surgeon: Geoffry ParadiseJon M Bruce, MD;  Location: [facility] ORS;  Service: General;  Laterality: N/A;   PERCUTANEOUS PINNING Right 07/01/2020   Procedure: POSSIBLE PERCUTANEOUS PINNING EXTREMITY;  Surgeon: Allena NapoleonPace, Collier S, MD;  [facility]: [facility];  Service: Plastics;  Laterality: Right;   SHOULDER SURGERY Right    SHOULDER SURGERY Left 2010   [facility] [place]    There were no vitals filed for this visit.   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    Patient Stated Goals Want to get my motion and strength back to use my index finger    Currently in Pain? No/denies                [facility] OT Assessment - 09/14/20 0001       Right Hand AROM   R Index  MCP 0-90 90 Degrees    R Index PIP 0-100 65 Degrees   70 in session - MC block at 90- 75 PIP flexion                     OT Treatments/Exercises (OP) - 09/14/20 0001       RUE Contrast Bath   Time 8 minutes    Comments prior to ROM and  ed on doing at home end of day             IN session got PROM 80 PIP and AROM 75 - MC 90      Cont to have edema over more than 0.5 cm in 2nd DIP and middle phalanges    Tolerate massage - ed on scar massage and mobs - and fitted with silicon compression sleeve for daytime - cut off tip to use on computer touch screen - 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 Tolerate soft textures -  and working on rougher textures, tapping , vibration with toothbrush  Force himself to use in holding cup, working dough at work and tie shoes- do not need to wear buddy strap as much                  Review again edema control - reinforce with pt to do contrast end of day and coban compression at night time   Tendon glides - block - MC flexion and PIP flexion and composite to 2 finger out of palm But block wrist - maintain wrist extention  Opposition to all digits   10 reps  3-5  x day after contrast  Wrist extention stretch in between Pt to block MC at 90 with pen when doing  PROM for PIP flexion-and composite flexion   Blocked intrinsic fist - or when composite fist - block MC with pen at 90 -slight pull less than 2/10  Initiate this date for PIP extention rubberband - block MC in full ext -and do 2 x 10 reps    And keep pain under 2/10  Light pull or stretch  Cont using palms or forearm to pick up or carry large items, enlarge grips - to prevent wrist flexion during gripping activites - over use of 5th and 4th flexion  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       OT Education - 09/14/20 1118     Education Details progress and changes to HEP    Person(s) Educated Patient    Methods Explanation;Demonstration;Tactile cues;Verbal cues;Handout    Comprehension Verbalized understanding              OT Short Term Goals - 07/28/20 1550       OT SHORT TERM GOAL #1   Title Independent with splint wear and care    Time 4    Period Weeks    Status On-going      OT SHORT TERM GOAL #2   Title Independent with scar massage and desensitization techniques    Time 4    Period Weeks    Status New      OT SHORT TERM GOAL #3   Title Independent with HEP for ROM    Time 4    Period Weeks    Status New               OT Long Term Goals - 07/28/20 1551       OT LONG TERM GOAL #1   Title Independent with strengthening HEP    Time 8    Period Weeks    Status New      OT LONG TERM GOAL #2   Title Pt to demo ROM WFL's for thumb and digits 3-5 and index PIP flex to 90* or greater    Time 8    Period Weeks    Status  New      OT LONG TERM GOAL #3   Title Pt to demo 30 lbs or greater grip strength Rt hand for work tasks    Time 8    Period Weeks    Status New      OT LONG TERM GOAL #4   Title Pt to return to using Rt hand as dominant hand for ADLS including writing    Time 8    Period Weeks    Status New                    Plan - 09/14/20 1118     Clinical Impression Statement Pt is a 55 y.o. 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 Pt  is about 10wks s/p and making great progress in flexion of 2nd digit and functional use- the  last 2 wks lingering edema limiting progress of AROM in R  2nd digit - Reinforce for pt to use contrast at the end of the day and block MC at 90 to put force to PIP - progress well in session today to 75 AROM and PROM 80 - and initiate rubber band for strengthening to PIP extention of 2nd digit.   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 Pt will benfit from continued O.T. progress  ROM and strengthening , and return pt to use of Rt dominant hand.    OT Occupational Profile and History Problem Focused Assessment - Including review of records relating to presenting problem    Occupational performance deficits (Please refer to evaluation for details): ADL's;IADL's;Work;Leisure    Body Structure / Function / Physical Skills ADL;Pain;UE functional use;IADL;ROM;Scar mobility;Coordination;Sensation;FMC;Strength    Rehab Potential Good    Clinical Decision Making Limited treatment options, no task modification necessary    Comorbidities Affecting Occupational Performance: May have comorbidities impacting occupational performance    Modification or Assistance to Complete Evaluation  No modification of tasks or assist necessary to complete eval    OT Frequency 2x / week    OT Duration 6 weeks    OT Treatment/Interventions Self-care/ADL training;Moist Heat;Fluidtherapy;DME and/or AE instruction;Splinting;Compression bandaging;Therapeutic activities;Ultrasound;Therapeutic  exercise;Scar mobilization;Cryotherapy;Passive range of motion;Electrical Stimulation;Paraffin;Manual Therapy;Patient/family education    Consulted and Agree with Plan of Care Patient             Patient will benefit from skilled therapeutic intervention in order to improve the following deficits and impairments:   Body Structure / Function / Physical Skills: ADL, Pain, UE functional use, IADL, ROM, Scar mobility, Coordination, Sensation, FMC, Strength       Visit Diagnosis: Stiffness of right hand, not elsewhere classified  Pain in right hand  Other lack of coordination  Muscle weakness (generalized)  Stiffness of finger joint of right hand  Localized edema    Problem List Patient Active Problem List   Diagnosis Date Noted   Varicose veins with pain 07/28/2019   OSA (obstructive sleep apnea) 10/09/2018   Morbid obesity (HCC) 10/12/2015   Anemia of chronic disease 08/03/2015   Depression with anxiety 08/03/2015   GERD without esophagitis 08/03/2015   HTN, goal below 140/80 08/03/2015   Hyperlipidemia, mixed 08/03/2015    Charnetta Wulff OTR/L,CLT 09/14/2020, 11:24 AM  [place] [facility] [address], [place], [address] Phone: [phone]   Fax:  [phone]  Name: Daequan Kozma MRN: [id number] Date of Birth: Jun 04, 1965

## 2020-09-23 ENCOUNTER — Ambulatory Visit: Payer: 59 | Admitting: Occupational Therapy

## 2020-09-23 DIAGNOSIS — R278 Other lack of coordination: Secondary | ICD-10-CM

## 2020-09-23 DIAGNOSIS — R600 Localized edema: Secondary | ICD-10-CM

## 2020-09-23 DIAGNOSIS — M25641 Stiffness of right hand, not elsewhere classified: Secondary | ICD-10-CM | POA: Diagnosis not present

## 2020-09-23 DIAGNOSIS — M6281 Muscle weakness (generalized): Secondary | ICD-10-CM

## 2020-09-23 DIAGNOSIS — M79641 Pain in right hand: Secondary | ICD-10-CM

## 2020-09-23 NOTE — Therapy (Signed)
[place] [facility] [address], [place], [address] Phone: [phone]   Fax:  [phone]  Occupational Therapy Treatment  Patient Details  Name: Kent Brooks MRN: [id number] Date of Birth: November 20, 1965 Referring Provider (OT): Dr. Arita Miss   Encounter Date: 09/23/2020   OT End of Session - 09/23/20 1149     Visit Number 7    Number of Visits 16    Date for OT Re-Evaluation 09/28/20    OT Start Time 1034    OT Stop Time 1124    OT Time Calculation (min) 50 min    Activity Tolerance Patient tolerated treatment well    Behavior During Therapy [facility] for tasks assessed/performed             Past Medical History:  Diagnosis Date   Anxiety    Asthma    childhood asthma   Depression    GERD (gastroesophageal reflux disease)    History of hiatal hernia    Sleep apnea    C-PAP    Past Surgical History:  Procedure Laterality Date   AMPUTATION Right 07/01/2020   Procedure: REVISION AMPUTATION OF INDEX FINGER;  Surgeon: Allena Napoleon, MD;  [facility]: [facility] OR;  Service: Plastics;  Laterality: Right;   CHOLECYSTECTOMY N/A 10/12/2015   Procedure: LAPAROSCOPIC CHOLECYSTECTOMY;  Surgeon: Geoffry Paradise, MD;  Location: [facility] ORS;  Service: General;  Laterality: N/A;   HIATAL HERNIA REPAIR N/A 10/12/2015   Procedure: LAPAROSCOPIC REPAIR OF HIATAL HERNIA;  Surgeon: Geoffry Paradise, MD;  Location: [facility] ORS;  Service: General;  Laterality: N/A;   KNEE ARTHROSCOPY W/ ACL RECONSTRUCTION Right    LAPAROSCOPIC GASTRIC SLEEVE RESECTION N/A 10/12/2015   Procedure: LAPAROSCOPIC GASTRIC SLEEVE RESECTION;  Surgeon: Geoffry Paradise, MD;  Location: [facility] ORS;  Service: General;  Laterality: N/A;   PERCUTANEOUS PINNING Right 07/01/2020   Procedure: POSSIBLE PERCUTANEOUS PINNING EXTREMITY;  Surgeon: Allena Napoleon, MD;  Location: [facility] OR;  Service: Plastics;  Laterality: Right;   SHOULDER SURGERY Right    SHOULDER SURGERY Left 2010   [facility], [place]    There were no vitals filed for this visit.   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    Patient Stated Goals Want to get my motion and strength back to use my index finger    Currently in Pain? No/denies                [facility] OT Assessment - 09/23/20 0001       Strength   Right Hand Grip (lbs) 44    Right Hand Lateral Pinch 17 lbs    Right Hand 3 Point Pinch 15 lbs   17   Left Hand Grip (lbs) 85    Left Hand Lateral Pinch 23 lbs    Left Hand 3 Point Pinch 16 lbs   18             Pain with 2 and 3 point pinch as  well as Lat grip        OT Treatments/Exercises (OP) - 09/23/20 0001       RUE Fluidotherapy   Number Minutes Fluidotherapy 10 Minutes    RUE Fluidotherapy Location Hand;Wrist    Comments ice 2 x rotation to decrease edema              Edema decrease after fluido -and AROM PIP flexion increase to 80 - no pain      Cont to have edema over 1 cm this date  2nd DIP and middle phalanges    Tolerate massage - done graston tool nr 2 on volar wrist and forearm- to decrease discomfort of volar wrist- pt compensated for weeks with collapsing into wrist flexion with gripping and fisting                 Review again edema control - reinforce with pt to do contrast end of day and coban compression at night time   Tendon glides - block - MC flexion and PIP flexion and composite to 2 finger out of palm But block wrist - maintain wrist extention  Opposition to all digits   10 reps  3-5 x  day after contrast  Wrist extention stretch in between Pt to block MC at 90 with pen when doing  PROM for PIP flexion-and composite flexion    Blocked intrinsic fist - or when composite fist - block MC with pen at 90 -slight pull less than 2/10  Rolling on thigh composite flexion of digits -  And then green putty firm for gripping with ulnar side of hand -and pain free and not over do  Pinch 2 point small pieces and rolling it for facilitating of hyper ext of IP 2nd digit And rolling pen  Remove 1 cm object out of putty - 2 point  NOT OVER DO    And keep pain under 2/10  Light pull or stretch  Cont using palms or forearm to pick up or carry large items, enlarge grips - to prevent wrist flexion during gripping activites - over use of 5th and 4th flexion  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                 OT Education - 09/23/20 1149     Education Details progress and changes to HEP    Person(s) Educated Patient    Methods Explanation;Demonstration;Tactile cues;Verbal cues;Handout    Comprehension Verbalized understanding              OT Short Term Goals - 07/28/20 1550       OT SHORT TERM GOAL #1   Title Independent with splint wear and care    Time 4    Period Weeks    Status On-going      OT SHORT TERM GOAL #2   Title Independent with scar massage and desensitization techniques    Time 4    Period Weeks    Status New      OT SHORT TERM GOAL #3   Title Independent with HEP for ROM    Time 4    Period Weeks    Status New               OT Long Term Goals - 07/28/20 1551       OT LONG TERM GOAL #1   Title Independent with strengthening HEP    Time 8  Period Weeks    Status New      OT LONG TERM GOAL #2   Title Pt to demo ROM WFL's for thumb and digits 3-5 and index PIP flex to 90* or greater    Time 8    Period Weeks    Status New      OT LONG TERM GOAL #3   Title Pt to demo 30 lbs or greater grip strength Rt  hand for work tasks    Time 8    Period Weeks    Status New      OT LONG TERM GOAL #4   Title Pt to return to using Rt hand as dominant hand for ADLS including writing    Time 8    Period Weeks    Status New                   Plan - 09/23/20 1150     Clinical Impression Statement Pt is a 55 y.o. 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 Pt will benfit from continued O.T. progress  ROM and strengthening , and return pt to use of Rt dominant hand.    OT Occupational Profile and History Problem Focused Assessment - Including review of records relating to presenting problem    Occupational performance deficits (Please refer to evaluation for details): ADL's;IADL's;Work;Leisure    Body Structure / Function / Physical Skills ADL;Pain;UE functional use;IADL;ROM;Scar mobility;Coordination;Sensation;FMC;Strength    Rehab Potential Good    Clinical Decision Making Limited treatment options, no task  modification necessary    Comorbidities Affecting Occupational Performance: May have comorbidities impacting occupational performance    Modification or Assistance to Complete Evaluation  No modification of tasks or assist necessary to complete eval    OT Frequency 1x / week    OT Duration 6 weeks    OT Treatment/Interventions Self-care/ADL training;Moist Heat;Fluidtherapy;DME and/or AE instruction;Splinting;Compression bandaging;Therapeutic activities;Ultrasound;Therapeutic exercise;Scar mobilization;Cryotherapy;Passive range of motion;Electrical Stimulation;Paraffin;Manual Therapy;Patient/family education    Consulted and Agree with Plan of Care Patient             Patient will benefit from skilled therapeutic intervention in order to improve the following deficits and impairments:   Body Structure / Function / Physical Skills: ADL, Pain, UE functional use, IADL, ROM, Scar mobility, Coordination, Sensation, FMC, Strength       Visit Diagnosis: Stiffness of right  hand, not elsewhere classified  Pain in right hand  Other lack of coordination  Muscle weakness (generalized)  Stiffness of finger joint of right hand  Localized edema    Problem List Patient Active Problem List   Diagnosis Date Noted   Varicose veins with pain 07/28/2019   OSA (obstructive sleep apnea) 10/09/2018   Morbid obesity (HCC) 10/12/2015   Anemia of chronic disease 08/03/2015   Depression with anxiety 08/03/2015   GERD without esophagitis 08/03/2015   HTN, goal below 140/80 08/03/2015   Hyperlipidemia, mixed 08/03/2015    Oletta Cohn, OTR/L,CLT 09/23/2020, 12:01 PM  [place] [facility] [address], [place], [address] Phone: [phone]   Fax:  [phone]  Name: Kent Brooks MRN: [id number] Date of Birth: November 23, 1965

## 2020-10-04 ENCOUNTER — Ambulatory Visit: Payer: 59 | Admitting: Occupational Therapy

## 2020-10-04 DIAGNOSIS — R600 Localized edema: Secondary | ICD-10-CM

## 2020-10-04 DIAGNOSIS — M25641 Stiffness of right hand, not elsewhere classified: Secondary | ICD-10-CM

## 2020-10-04 DIAGNOSIS — M79641 Pain in right hand: Secondary | ICD-10-CM

## 2020-10-04 DIAGNOSIS — M6281 Muscle weakness (generalized): Secondary | ICD-10-CM

## 2020-10-04 DIAGNOSIS — R278 Other lack of coordination: Secondary | ICD-10-CM

## 2020-10-04 NOTE — Therapy (Signed)
[place] [facility] [address], [place], [address] Phone: [phone]   Fax:  [phone]  Occupational Therapy Treatment  Patient Details  Name: Kent Brooks MRN: [id number] Date of Birth: [birth year] Referring Provider (OT): Dr. Arita Miss   Encounter Date: 10/04/2020   OT End of Session - 10/04/20 0924     Visit Number 8    Number of Visits 16    Date for OT Re-Evaluation 09/28/20    OT Start Time 0905    OT Stop Time 0947    OT Time Calculation (min) 42 min    Activity Tolerance Patient tolerated treatment well    Behavior During Therapy [facility] for tasks assessed/performed             Past Medical History:  Diagnosis Date   Anxiety    Asthma    childhood asthma   Depression    GERD (gastroesophageal reflux disease)    History of hiatal hernia    Sleep apnea    C-PAP    Past Surgical History:  Procedure Laterality Date   AMPUTATION Right 07/01/2020   Procedure: REVISION AMPUTATION OF INDEX FINGER;  Surgeon: Allena Napoleon, MD;  [facility]: [facility] OR;  Service: Plastics;  Laterality: Right;   CHOLECYSTECTOMY N/A 10/12/2015   Procedure: LAPAROSCOPIC CHOLECYSTECTOMY;  Surgeon: Geoffry Paradise, MD;  Location: [facility] ORS;  Service: General;  Laterality: N/A;   HIATAL HERNIA REPAIR N/A 10/12/2015   Procedure: LAPAROSCOPIC REPAIR OF HIATAL HERNIA;  Surgeon: Geoffry Paradise, MD;  Location: [facility] ORS;  Service: General;  Laterality: N/A;   KNEE ARTHROSCOPY W/ ACL RECONSTRUCTION Right    LAPAROSCOPIC GASTRIC SLEEVE RESECTION N/A 10/12/2015   Procedure: LAPAROSCOPIC GASTRIC SLEEVE RESECTION;  Surgeon: Geoffry Paradise, MD;  Location: [facility] ORS;  Service: General;  Laterality: N/A;   PERCUTANEOUS PINNING Right 07/01/2020   Procedure: POSSIBLE PERCUTANEOUS PINNING EXTREMITY;  Surgeon: Allena Napoleon, MD;  [facility]: [facility];  Service: Plastics;  Laterality: Right;   SHOULDER SURGERY Right    SHOULDER SURGERY Left 2010   [facility], [place]    There were no vitals filed for this visit.   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    Patient Stated Goals Want to get my motion and strength back to use my index finger    Currently in Pain? Yes    Pain Location Arm    Pain Orientation Right    Pain Descriptors / Indicators Numbness;Aching    Pain Type Acute pain;Surgical pain    Pain Frequency Intermittent                [facility] OT Assessment - 10/04/20 0001       Strength   Right Hand Grip (lbs) 38    Right Hand Lateral Pinch 15 lbs      Right Hand AROM   R Index  MCP 0-90 85 Degrees    R Index PIP 0-100 75 Degrees  OT Treatments/Exercises (OP) - 10/04/20 0001       RUE Contrast Bath   Time 8 minutes    Comments decreas pain, numbness - wrist and hand                Cont to have edema over 1 cm 2nd DIP and middle phalanges    Tolerate massage - done graston tool nr 2 on volar /dorsal wrist and forearm- to decrease discomfort of volar wrist- pt compensated for weeks with collapsing into wrist flexion with gripping and fisting  And traction with carpal rolls to wrist to increase AROM with less pain -pt did had increase flexion , extention with less pain at wrist             Review again edema control - reinforce with pt to do contrast end of day and isotoner glove  at night time   Tendon glides - block - MC flexion and PIP flexion and composite to 1 finger out of palm But block wrist - maintain wrist extention  Opposition to all digits    10 reps  3-5 x day after contrast  Wrist extention stretch in between Pt to block MC at 90 with pen when doing  PROM for PIP flexion-and composite flexion  but not if pain increase or numbness present  Pt to hold of on putty - 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  Report numbness at night time the whole arm  Discuss with pt wearing isotoner glove and wrist splint night time  And look at pillow and sleeping position Discuss with his chiropractor about arm going numb sounds more like cervical - he is tight upper traps and neck per pt  And reinforce again  Cont using palms or forearm to pick up or carry large items, enlarge grips - to prevent wrist flexion during gripping activites  or gripping to tight  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                   OT Education - 10/04/20 2043     Education Details progress and changes to HEP    Person(s) Educated Patient    Methods Explanation;Demonstration;Tactile cues;Verbal cues;Handout    Comprehension Verbalized understanding              OT Short Term Goals - 07/28/20 1550       OT SHORT TERM GOAL #1   Title Independent with splint wear and care    Time 4    Period Weeks    Status On-going      OT SHORT TERM GOAL #2   Title Independent with scar massage and desensitization techniques    Time 4    Period Weeks    Status New      OT SHORT TERM GOAL #3   Title Independent with HEP for ROM    Time 4    Period Weeks    Status New               OT Long Term Goals - 07/28/20 1551       OT LONG TERM GOAL #1   Title Independent with strengthening HEP    Time 8    Period Weeks    Status New      OT LONG TERM GOAL #2   Title Pt to demo ROM WFL's for thumb and digits  3-5 and index PIP flex to 90* or greater    Time 8    Period Weeks    Status New      OT LONG TERM GOAL #3   Title Pt to demo 30 lbs or greater grip strength Rt hand for work  tasks    Time 8    Period Weeks    Status New      OT LONG TERM GOAL #4   Title Pt to return to using Rt hand as dominant hand for ADLS including writing    Time 8    Period Weeks    Status New                   Plan - 10/04/20 0924     Clinical Impression Statement Pt is a 55 y.o. 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 Pt to have his Chiropractor look at his neck /cervical? - Reinforce for pt to use contrast at the end of the day and compression glove provided  - Pt MC flexion was 90 today and PIP 75 coming in.But did not push him past that today - focus on soft tisse to elbow , forerarm, wrist and hand to decrease pain , stiffness and sensory changes.Pt to hold off on any putty use -and modify his grip - enlarge grip and use palms to pick up and carry objects.. 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 Pt will benfit from continued O.T. progress  ROM and strengthening , and return pt to use of Rt dominant hand.    OT Occupational Profile and History Problem Focused Assessment - Including review of records relating to presenting problem    Occupational performance deficits (Please refer to evaluation for  details): ADL's;IADL's;Work;Leisure    Body Structure / Function / Physical Skills ADL;Pain;UE functional use;IADL;ROM;Scar mobility;Coordination;Sensation;FMC;Strength    Rehab Potential Good    Clinical Decision Making Limited treatment options, no task modification necessary    Comorbidities Affecting Occupational Performance: May have comorbidities impacting occupational performance    Modification or Assistance to Complete Evaluation  No modification of tasks or assist necessary to complete eval    OT Frequency 1x / week    OT Duration 6 weeks    OT Treatment/Interventions Self-care/ADL training;Moist Heat;Fluidtherapy;DME and/or AE instruction;Splinting;Compression bandaging;Therapeutic activities;Ultrasound;Therapeutic exercise;Scar mobilization;Cryotherapy;Passive range of motion;Electrical Stimulation;Paraffin;Manual Therapy;Patient/family education    Consulted and Agree with Plan of Care Patient             Patient will benefit from skilled therapeutic intervention in order to improve the following deficits and impairments:   Body Structure / Function / Physical  Skills: ADL, Pain, UE functional use, IADL, ROM, Scar mobility, Coordination, Sensation, FMC, Strength       Visit Diagnosis: Stiffness of right hand, not elsewhere classified  Pain in right hand  Other lack of coordination  Muscle weakness (generalized)  Stiffness of finger joint of right hand  Localized edema    Problem List Patient Active Problem List   Diagnosis Date Noted   Varicose veins with pain 07/28/2019   OSA (obstructive sleep apnea) 10/09/2018   Morbid obesity (HCC) 10/12/2015   Anemia of chronic disease 08/03/2015   Depression with anxiety 08/03/2015   GERD without esophagitis 08/03/2015   HTN, goal below 140/80 08/03/2015   Hyperlipidemia, mixed 08/03/2015    Oletta Cohn, OTR/L,CLT 10/04/2020, 8:51 PM  [place] [facility]  [facility]ICINE [address], [place], [address] Phone: [phone]   Fax:  [phone]  Name: Kent Brooks MRN: [id number] Date of Birth: [birth year]

## 2020-10-11 ENCOUNTER — Ambulatory Visit: Payer: 59 | Attending: Plastic Surgery | Admitting: Occupational Therapy

## 2020-12-01 ENCOUNTER — Ambulatory Visit: Payer: 59 | Admitting: Plastic Surgery

## 2020-12-23 ENCOUNTER — Ambulatory Visit (INDEPENDENT_AMBULATORY_CARE_PROVIDER_SITE_OTHER): Payer: 59 | Admitting: Plastic Surgery

## 2020-12-23 ENCOUNTER — Other Ambulatory Visit: Payer: Self-pay

## 2020-12-23 DIAGNOSIS — S6991XA Unspecified injury of right wrist, hand and finger(s), initial encounter: Secondary | ICD-10-CM | POA: Diagnosis not present

## 2020-12-23 NOTE — Progress Notes (Signed)
° °  Referring Provider Marguarite Arbour, MD [address] [facility] [place],  [place] [address]   CC:  Chief Complaint  Patient presents with   Follow-up      Kent Brooks is an 55 y.o. male.  HPI: Patient presents about 6 months out from treatment of a right index finger avulsion injury.  He required open reduction percutaneous fixation of the middle phalanx fracture and primary repair of his extensor tendon.  He feels like things are coming along well in terms of his function.  He does report intermittent cold and shocking sensations to the index finger.  Review of Systems General: Denies fevers and chills  Physical Exam Vitals with BMI 07/01/2020 07/01/2020 07/01/2020  Height - - -  Weight - - -  BMI - - -  Systolic 147 147 983  Diastolic 80 88 84  Pulse 78 87 88    General:  No acute distress,  Alert and oriented, Non-Toxic, Normal speech and affect Right hand: Fingers well-perfused normal cap refill palp radial pulse.  He does have some sensation in the partially amputated distal index finger but its not completely normal.  He has pretty limited range of motion at the DIP joint but can fully extend the PIP joint and can flex it to around 90 degrees.  He does have a small persistent extensor lag at the DIP joint.  Assessment/Plan Patient presents with overall a good outcome after right index finger injury.  He is back to work and overall is doing fine.  He has had to learn some modifications in his tasks but is working through those.  I am not sure he would benefit much more from therapy at this point and he is in agreement with that.  Ultimately I feel like he is come along and done well we will plan to see him again on an as-needed basis.  All of his questions were answered.  Allena Napoleon 12/23/2020, 10:42 AM

## 2021-04-04 DIAGNOSIS — E782 Mixed hyperlipidemia: Secondary | ICD-10-CM | POA: Diagnosis not present

## 2021-04-04 DIAGNOSIS — E162 Hypoglycemia, unspecified: Secondary | ICD-10-CM | POA: Diagnosis not present

## 2021-04-04 DIAGNOSIS — Z79899 Other long term (current) drug therapy: Secondary | ICD-10-CM | POA: Diagnosis not present

## 2021-04-26 DIAGNOSIS — I10 Essential (primary) hypertension: Secondary | ICD-10-CM | POA: Diagnosis not present

## 2021-04-26 DIAGNOSIS — E782 Mixed hyperlipidemia: Secondary | ICD-10-CM | POA: Diagnosis not present

## 2021-04-26 DIAGNOSIS — D638 Anemia in other chronic diseases classified elsewhere: Secondary | ICD-10-CM | POA: Diagnosis not present

## 2021-04-26 DIAGNOSIS — R69 Illness, unspecified: Secondary | ICD-10-CM | POA: Diagnosis not present

## 2021-11-07 ENCOUNTER — Encounter (INDEPENDENT_AMBULATORY_CARE_PROVIDER_SITE_OTHER): Payer: Self-pay

## 2023-04-08 IMAGING — CR DG HAND COMPLETE 3+V*R*
3 series · 3 of 3 positions shown · non-contrast
Comparison: Right index finger x-rays dated July 01, 2020.

CLINICAL DATA: Follow-up index finger injury.

EXAM:
RIGHT HAND - COMPLETE 3+ VIEW

[hand ap]
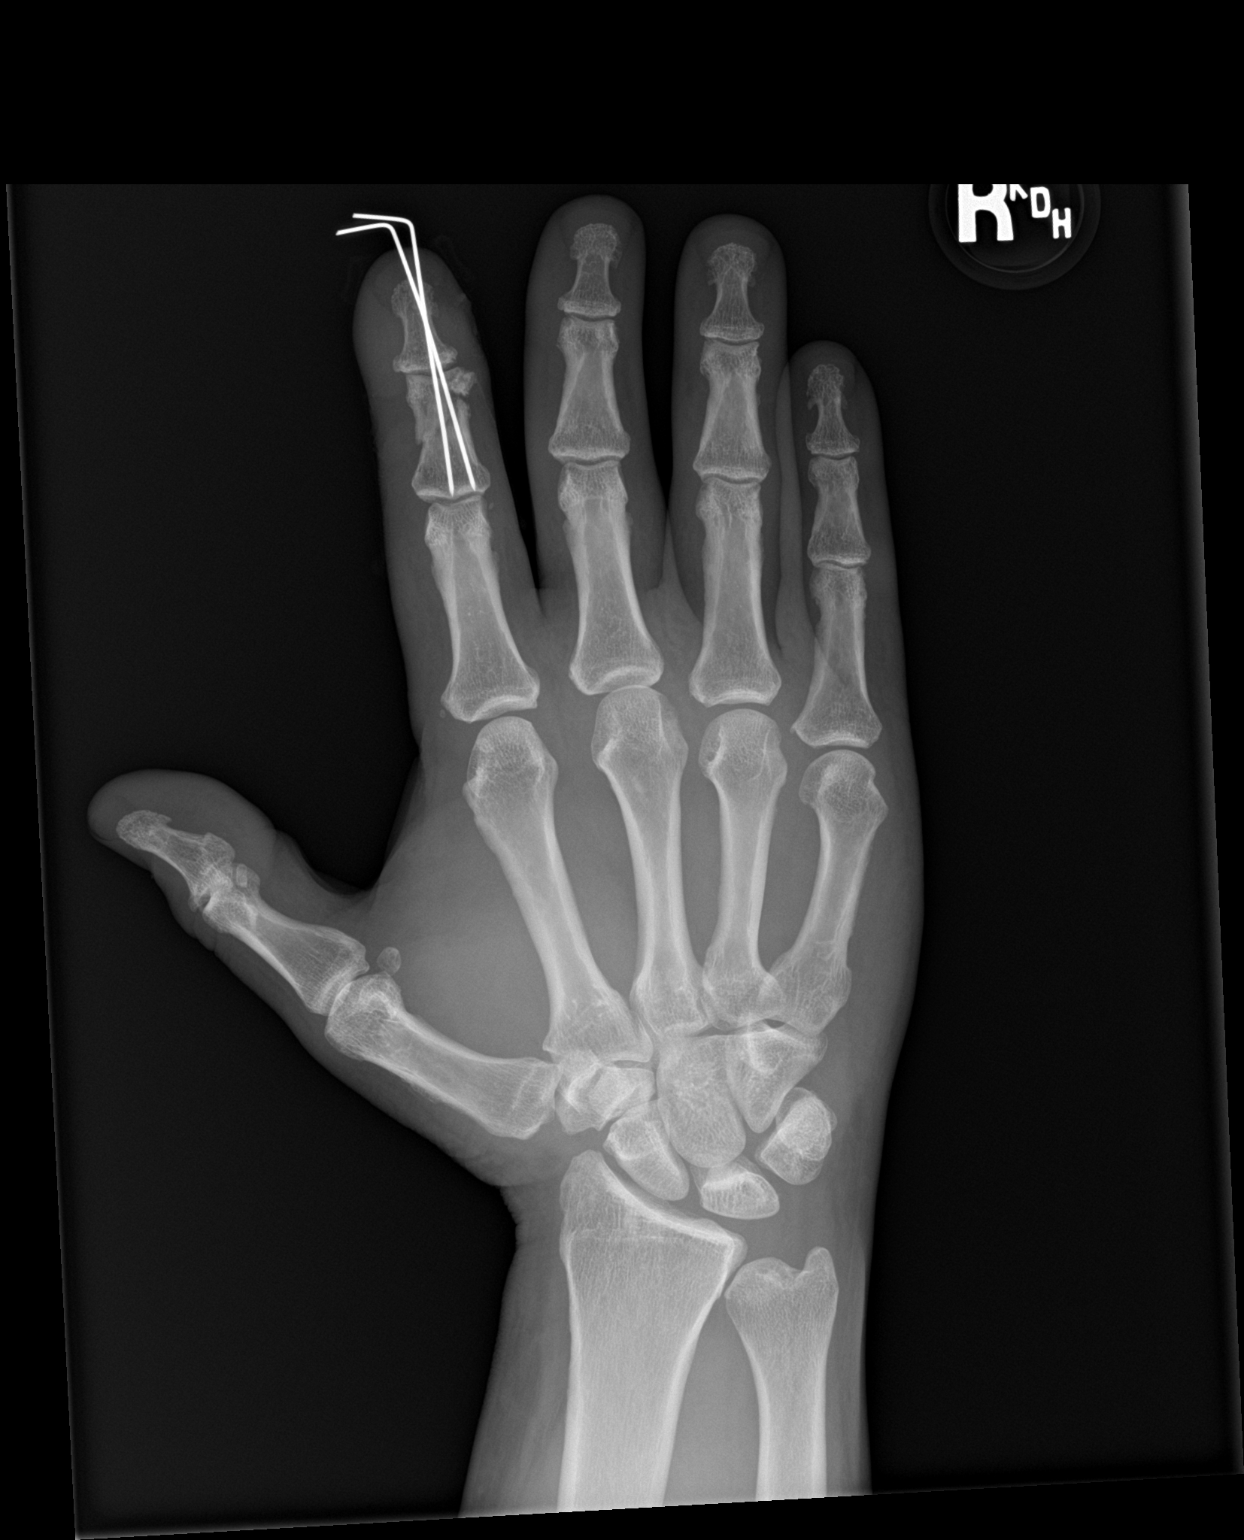

[hand obl]
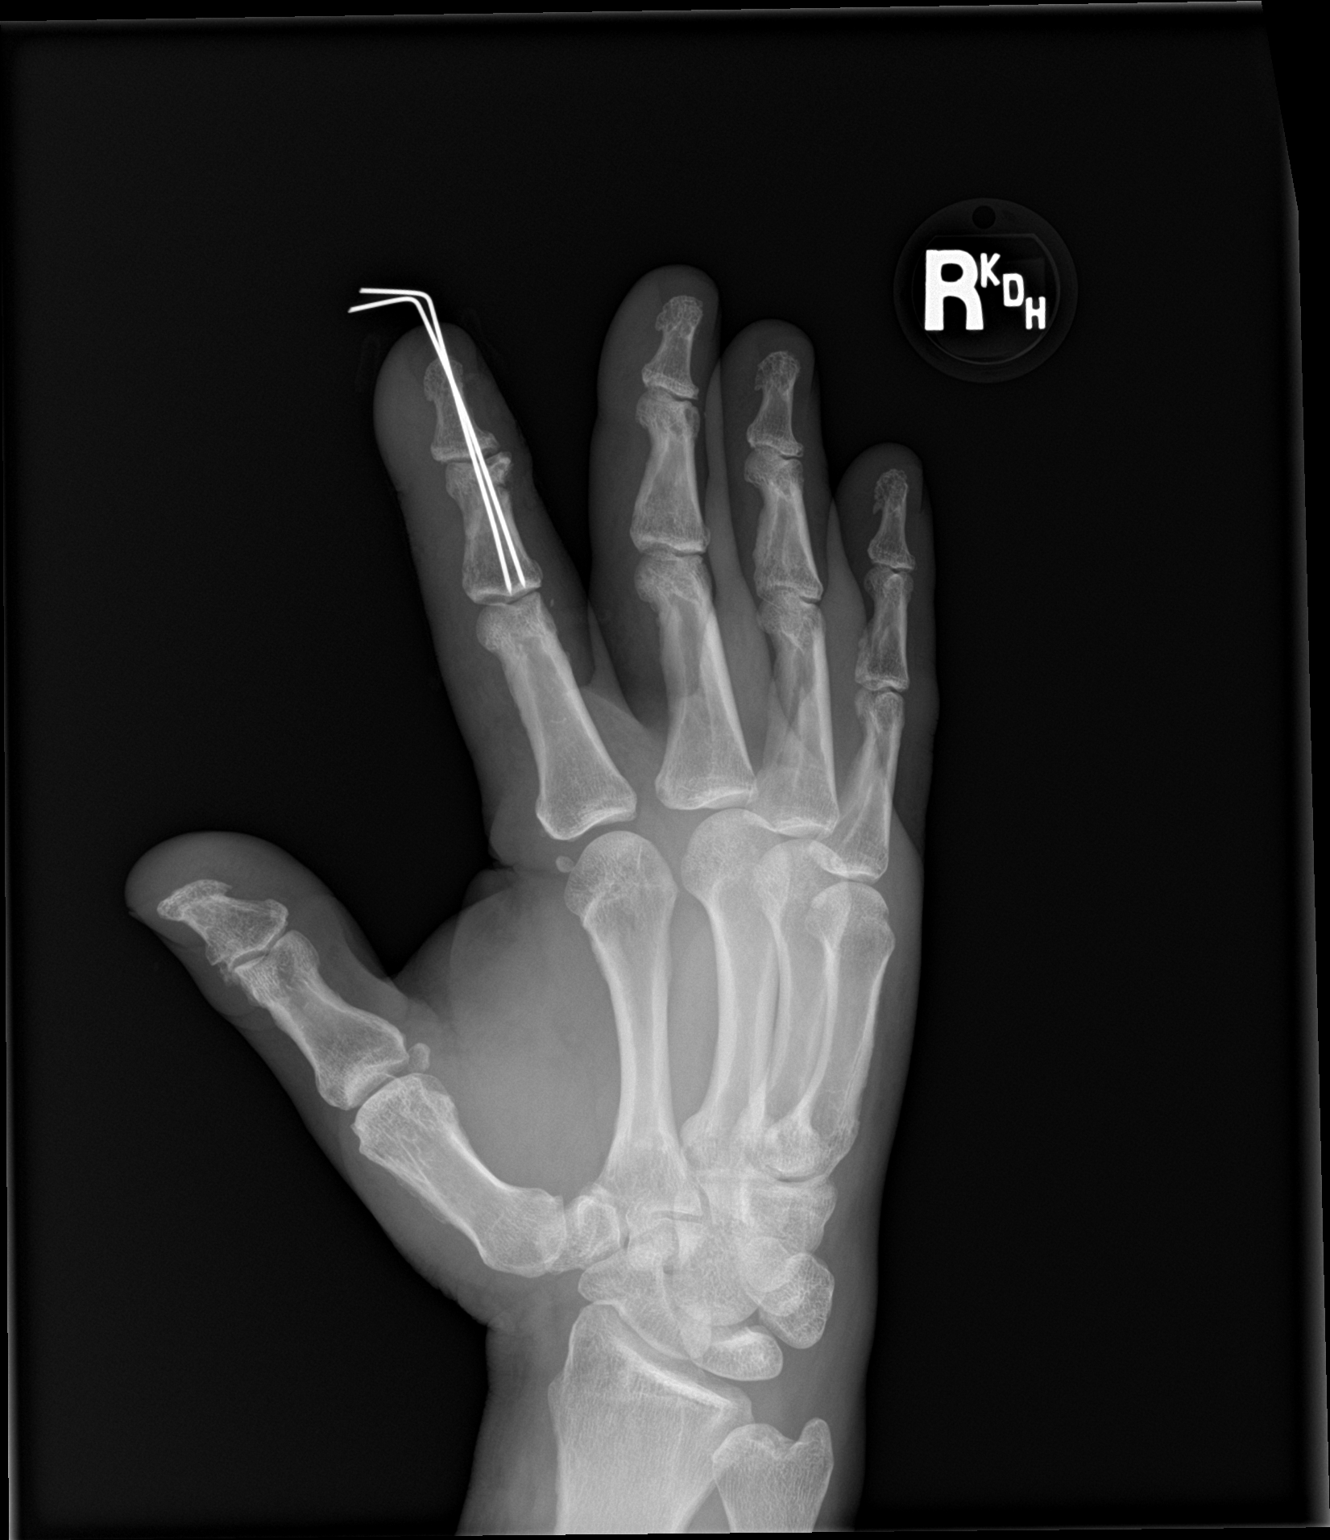

[hand lat]
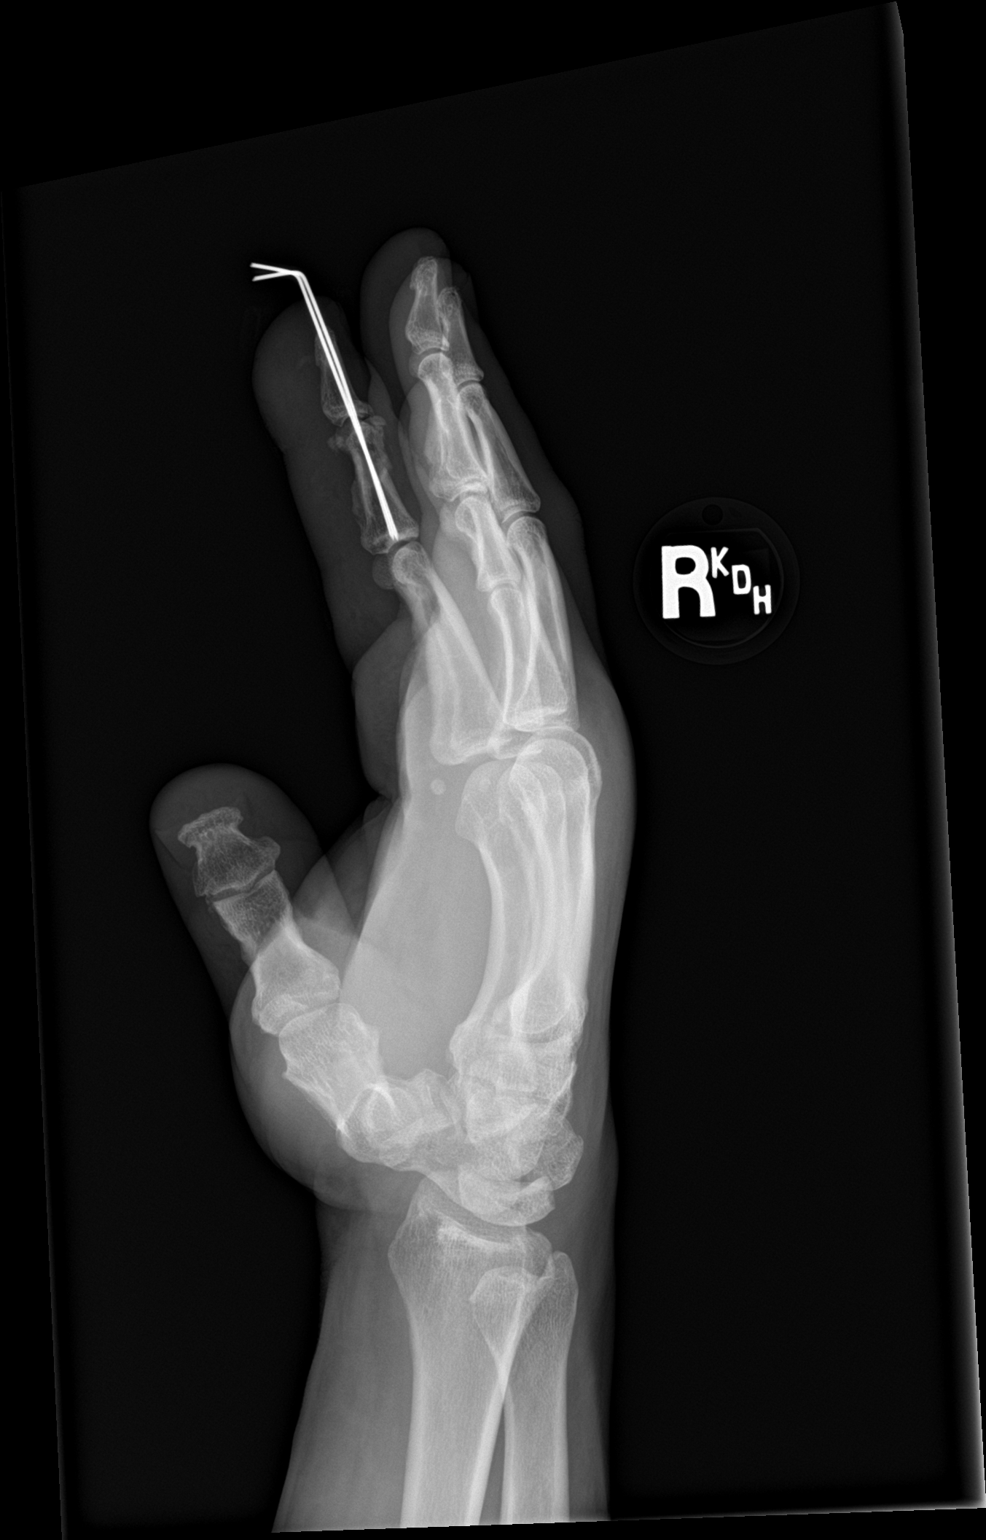

[3 of 3 positions shown; findings below may reference images not displayed]

FINDINGS: Significantly improved alignment of the second middle phalanx
comminuted fracture status post percutaneous pinning. There is some
early bridging bone along the radial margin of the fracture.
IMPRESSION: 1. Early second middle phalanx fracture healing status post
percutaneous pinning.
